# Patient Record
Sex: Female | Born: 1995 | Race: Black or African American | Hispanic: No | Marital: Single | Smoking: Former smoker
Health system: Southern US, Community
[De-identification: ages and names within clinical notes are randomized; demographics above are authoritative.]

---
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
## 2020-11-20 ENCOUNTER — Encounter (HOSPITAL_COMMUNITY): Payer: Self-pay | Admitting: Family Medicine

## 2020-11-20 ENCOUNTER — Other Ambulatory Visit: Payer: Self-pay

## 2020-11-20 ENCOUNTER — Inpatient Hospital Stay (HOSPITAL_COMMUNITY)
Admission: AD | Admit: 2020-11-20 | Discharge: 2020-11-20 | Disposition: A | Discharge status: Home / Self Care | Payer: Medicaid Other | Attending: Family Medicine | Admitting: Family Medicine

## 2020-11-20 DIAGNOSIS — Z711 Person with feared health complaint in whom no diagnosis is made: Secondary | ICD-10-CM

## 2020-11-20 DIAGNOSIS — N926 Irregular menstruation, unspecified: Secondary | ICD-10-CM | POA: Insufficient documentation

## 2020-11-20 NOTE — MAU Provider Note (Signed)
  Chief Complaint: No chief complaint on file.   Event Date/Time   First Provider Initiated Contact with Patient 11/20/20 1333      SUBJECTIVE HPI: Felicia Lucas is a 25 y.o. G1P0 who presents to maternity admissions for a pregnancy verification letter. She has no complaints. She has no pain or bleeding.   No past medical history on file. No past surgical history on file. Social History   Socioeconomic History   Marital status: Single    Spouse name: Not on file   Number of children: Not on file   Years of education: Not on file   Highest education level: Not on file  Occupational History   Not on file  Tobacco Use   Smoking status: Never   Smokeless tobacco: Not on file  Substance and Sexual Activity   Alcohol use: No   Drug use: Not on file   Sexual activity: Not on file  Other Topics Concern   Not on file  Social History Narrative   Not on file   Social Determinants of Health   Financial Resource Strain: Not on file  Food Insecurity: Not on file  Transportation Needs: Not on file  Physical Activity: Not on file  Stress: Not on file  Social Connections: Not on file  Intimate Partner Violence: Not on file   No current facility-administered medications on file prior to encounter.   Current Outpatient Medications on File Prior to Encounter  Medication Sig Dispense Refill   ibuprofen (ADVIL,MOTRIN) 200 MG tablet Take 200 mg by mouth every 8 (eight) hours as needed. For pain.     Allergies  Allergen Reactions   Food Rash    Eggplant    ROS:  Review of Systems  Gastrointestinal:  Negative for abdominal pain.  Genitourinary:  Negative for vaginal bleeding.   I have reviewed patient's Past Medical Hx, Surgical Hx, Family Hx, Social Hx, medications and allergies.   Physical Exam  Patient Vitals for the past 24 hrs:  BP Temp Temp src Pulse Resp SpO2 Height Weight  11/20/20 1302 101/66 98.4 F (36.9 C) Oral 77 19 99 % -- --  11/20/20 1257 -- -- -- -- --  -- 5\' 4"  (1.626 m) 85 kg   Physical Exam Vitals and nursing note reviewed.  Constitutional:      General: She is not in acute distress.    Appearance: She is not ill-appearing, toxic-appearing or diaphoretic.  Neurological:     Mental Status: She is alert and oriented to person, place, and time.  Psychiatric:        Behavior: Behavior normal.    MDM Patient denies any concerning symptoms in need of emergent evaluation.   ASSESSMENT MSE Complete  PLAN  Patient may go to [facility] for a pregnancy test and pregnancy verification letter.   [facility] NP 11/20/2020 1:35 PM

## 2020-11-20 NOTE — MAU Note (Signed)
Presents stating has had +HPT and wants confirmation.  Denies VB or abd pain

## 2020-11-21 ENCOUNTER — Ambulatory Visit (INDEPENDENT_AMBULATORY_CARE_PROVIDER_SITE_OTHER): Payer: Medicaid Other

## 2020-11-21 VITALS — BP 105/81 | HR 80 | Ht 64.0 in | Wt 185.8 lb

## 2020-11-21 DIAGNOSIS — Z3201 Encounter for pregnancy test, result positive: Secondary | ICD-10-CM

## 2020-11-21 LAB — POCT PREGNANCY, URINE: Preg Test, Ur: POSITIVE — AB

## 2020-11-21 NOTE — Progress Notes (Signed)
Pt here today for UPT. UPT in office today is positive. Pt denies any vaginal bleeding, abd pain at this time. Pt is taking PNV. Pt advised precautions for MAU. Pt will make New OB INTAKE appt soon. Pt verbalized understanding and agreeable to plan of care.   Pt interested in Mom/Baby Dyad program.   LMP: 10/11/20 EDD: 07/18/2021 [redacted]w[redacted]d  Labib Cwynar, RN

## 2020-11-22 ENCOUNTER — Inpatient Hospital Stay (HOSPITAL_COMMUNITY): Payer: Medicaid Other

## 2020-11-22 ENCOUNTER — Inpatient Hospital Stay (HOSPITAL_COMMUNITY)
Admission: AD | Admit: 2020-11-22 | Discharge: 2020-11-22 | Disposition: A | Discharge status: Home / Self Care | Payer: Medicaid Other | Attending: Obstetrics & Gynecology | Admitting: Obstetrics & Gynecology

## 2020-11-22 DIAGNOSIS — Z3A01 Less than 8 weeks gestation of pregnancy: Secondary | ICD-10-CM | POA: Diagnosis not present

## 2020-11-22 DIAGNOSIS — O3680X0 Pregnancy with inconclusive fetal viability, not applicable or unspecified: Secondary | ICD-10-CM | POA: Diagnosis not present

## 2020-11-22 DIAGNOSIS — Z79899 Other long term (current) drug therapy: Secondary | ICD-10-CM | POA: Insufficient documentation

## 2020-11-22 DIAGNOSIS — O26851 Spotting complicating pregnancy, first trimester: Secondary | ICD-10-CM | POA: Insufficient documentation

## 2020-11-22 DIAGNOSIS — R109 Unspecified abdominal pain: Secondary | ICD-10-CM | POA: Diagnosis not present

## 2020-11-22 DIAGNOSIS — M549 Dorsalgia, unspecified: Secondary | ICD-10-CM | POA: Insufficient documentation

## 2020-11-22 DIAGNOSIS — O4691 Antepartum hemorrhage, unspecified, first trimester: Secondary | ICD-10-CM

## 2020-11-22 DIAGNOSIS — O4690 Antepartum hemorrhage, unspecified, unspecified trimester: Secondary | ICD-10-CM

## 2020-11-22 DIAGNOSIS — O26891 Other specified pregnancy related conditions, first trimester: Secondary | ICD-10-CM | POA: Diagnosis present

## 2020-11-22 LAB — WET PREP, GENITAL
Clue Cells Wet Prep HPF POC: NONE SEEN
Sperm: NONE SEEN
Trich, Wet Prep: NONE SEEN
Yeast Wet Prep HPF POC: NONE SEEN

## 2020-11-22 LAB — CBC
HCT: 37.8 % (ref 36.0–46.0)
Hemoglobin: 13 g/dL (ref 12.0–15.0)
MCH: 30.2 pg (ref 26.0–34.0)
MCHC: 34.4 g/dL (ref 30.0–36.0)
MCV: 87.9 fL (ref 80.0–100.0)
Platelets: 272 10*3/uL (ref 150–400)
RBC: 4.3 MIL/uL (ref 3.87–5.11)
RDW: 12.6 % (ref 11.5–15.5)
WBC: 4.6 10*3/uL (ref 4.0–10.5)
nRBC: 0 % (ref 0.0–0.2)

## 2020-11-22 LAB — URINALYSIS, ROUTINE W REFLEX MICROSCOPIC
Bacteria, UA: NONE SEEN
Bilirubin Urine: NEGATIVE
Glucose, UA: NEGATIVE mg/dL
Ketones, ur: NEGATIVE mg/dL
Leukocytes,Ua: NEGATIVE
Nitrite: NEGATIVE
Protein, ur: NEGATIVE mg/dL
Specific Gravity, Urine: 1.021 (ref 1.005–1.030)
pH: 7 (ref 5.0–8.0)

## 2020-11-22 LAB — ABO/RH: ABO/RH(D): B POS

## 2020-11-22 LAB — HCG, QUANTITATIVE, PREGNANCY: hCG, Beta Chain, Quant, S: 517 m[IU]/mL — ABNORMAL HIGH (ref <5)

## 2020-11-22 MED ORDER — ACETAMINOPHEN 500 MG PO TABS
1000.0000 mg | ORAL_TABLET | Freq: Once | ORAL | Status: AC
  Administered 2020-11-22: 1000 mg via ORAL
  Filled 2020-11-22: qty 2

## 2020-11-22 NOTE — MAU Provider Note (Signed)
History     CSN: [id number]  Arrival date and time: 11/22/20 1943   Event Date/Time   First Provider Initiated Contact with Patient 11/22/20 2123      Chief Complaint  Patient presents with   Vaginal Bleeding   Abdominal Pain   MARGART ZEMANEK is a 25 y.o. G1P0 at [redacted]w[redacted]d by definite LMP of May 19th.  She presents today for Vaginal Bleeding and Abdominal Pain.  She states she noted some bleeding around 4pm today. She states she then started having some cramping that is located in her back and lower abdominal area.  She reports the cramps are constant and rates them a 8/10.  She states she noted the spotting with wiping and it was initially pink, then dark red, and now pink again. Patient states she has not taken anything for the cramping.  She endorses sexual activity in the past 2 days, but denies problems with urination, constipation, or diarrhea.    OB History     Gravida  1   Para      Term      Preterm      AB      Living         SAB      IAB      Ectopic      Multiple      Live Births              No past medical history on file.  No past surgical history on file.  No family history on file.  Social History   Tobacco Use   Smoking status: Never  Substance Use Topics   Alcohol use: No    Allergies:  Allergies  Allergen Reactions   Food Rash    Eggplant    Medications Prior to Admission  Medication Sig Dispense Refill Last Dose   Prenatal Vit-Fe Fumarate-FA (MULTIVITAMIN-PRENATAL) 27-0.8 MG TABS tablet Take 1 tablet by mouth daily at 12 noon.       Review of Systems  Constitutional:  Negative for chills and fever.  Gastrointestinal:  Positive for abdominal pain. Negative for constipation, diarrhea, nausea and vomiting.  Genitourinary:  Positive for dysuria and vaginal bleeding. Negative for difficulty urinating and dyspareunia.  Musculoskeletal:  Positive for back pain.  Neurological:  Positive for headaches (Earlier, none  currently). Negative for dizziness and light-headedness.  Physical Exam   Blood pressure 106/74, pulse 87, temperature 98.8 F (37.1 C), resp. rate 18, height 5\' 4"  (1.626 m), weight 84.8 kg, last menstrual period 10/12/2020.  Physical Exam Vitals reviewed.  Constitutional:      General: She is not in acute distress.    Appearance: She is well-developed.  HENT:     Head: Normocephalic and atraumatic.  Eyes:     Conjunctiva/sclera: Conjunctivae normal.  Cardiovascular:     Rate and Rhythm: Normal rate and regular rhythm.     Heart sounds: Normal heart sounds.  Pulmonary:     Effort: Pulmonary effort is normal. No respiratory distress.     Breath sounds: Normal breath sounds.  Abdominal:     General: Bowel sounds are normal.     Palpations: Abdomen is soft.     Tenderness: There is no abdominal tenderness (Reported, but not elicited).  Skin:    General: Skin is warm and dry.  Neurological:     Mental Status: She is alert and oriented to person, place, and time.  Psychiatric:  Mood and Affect: Mood normal.        Behavior: Behavior normal.        Thought Content: Thought content normal.    MAU Course  Procedures Results for orders placed or performed during the hospital encounter of 11/22/20 (from the past 24 hour(s))  Wet prep, genital     Status: Abnormal   Collection Time: 11/22/20  8:15 PM   Specimen: Vaginal  Result Value Ref Range   Yeast Wet Prep HPF POC NONE SEEN NONE SEEN   Trich, Wet Prep NONE SEEN NONE SEEN   Clue Cells Wet Prep HPF POC NONE SEEN NONE SEEN   WBC, Wet Prep HPF POC FEW (A) NONE SEEN   Sperm NONE SEEN   CBC     Status: None   Collection Time: 11/22/20  8:33 PM  Result Value Ref Range   WBC 4.6 4.0 - 10.5 K/uL   RBC 4.30 3.87 - 5.11 MIL/uL   Hemoglobin 13.0 12.0 - 15.0 g/dL   HCT 62.7 03.5 - 00.9 %   MCV 87.9 80.0 - 100.0 fL   MCH 30.2 26.0 - 34.0 pg   MCHC 34.4 30.0 - 36.0 g/dL   RDW 38.1 82.9 - 93.7 %   Platelets 272 150 - 400  K/uL   nRBC 0.0 0.0 - 0.2 %  hCG, quantitative, pregnancy     Status: Abnormal   Collection Time: 11/22/20  8:33 PM  Result Value Ref Range   hCG, Beta Chain, Quant, S 517 (H) <5 mIU/mL  ABO/Rh     Status: None   Collection Time: 11/22/20  8:33 PM  Result Value Ref Range   ABO/RH(D) B POS    No rh immune globuloin      NOT A RH IMMUNE GLOBULIN CANDIDATE, PT RH POSITIVE Performed at [facility], [address] [place], [place] [address]   Urinalysis, Routine w reflex microscopic Urine, Clean Catch     Status: Abnormal   Collection Time: 11/22/20  8:39 PM  Result Value Ref Range   Color, Urine YELLOW YELLOW   APPearance CLOUDY (A) CLEAR   Specific Gravity, Urine 1.021 1.005 - 1.030   pH 7.0 5.0 - 8.0   Glucose, UA NEGATIVE NEGATIVE mg/dL   Hgb urine dipstick LARGE (A) NEGATIVE   Bilirubin Urine NEGATIVE NEGATIVE   Ketones, ur NEGATIVE NEGATIVE mg/dL   Protein, ur NEGATIVE NEGATIVE mg/dL   Nitrite NEGATIVE NEGATIVE   Leukocytes,Ua NEGATIVE NEGATIVE   RBC / HPF 0-5 0 - 5 RBC/hpf   Bacteria, UA NONE SEEN NONE SEEN   Squamous Epithelial / LPF 6-10 0 - 5   Mucus PRESENT    US OB LESS THAN 14 WEEKS WITH OB TRANSVAGINAL  Result Date: 11/22/2020 CLINICAL DATA:  Vaginal bleeding EXAM: OBSTETRIC <14 WK [place] AND TRANSVAGINAL OB US TECHNIQUE: Both transabdominal and transvaginal ultrasound examinations were performed for complete evaluation of the gestation as well as the maternal uterus, adnexal regions, and pelvic cul-de-sac. Transvaginal technique was performed to assess early pregnancy. COMPARISON:  None. FINDINGS: Intrauterine gestational sac: None Yolk sac:  Not Visualized. Embryo:  Not Visualized. Maternal uterus/adnexae: Ovaries are within normal limits. Right ovary measures 2.8 x 2.9 by 2.4 cm. The left ovary measures 3.5 x 3 by 2.5 cm. No significant free fluid IMPRESSION: No IUP identified. Findings consistent with pregnancy of unknown location, differential of which includes  IUP too early to visualize, recent failed pregnancy, and occult ectopic pregnancy. Recommend trending of HCG  with repeat ultrasound as indicated Electronically Signed   By: Jasmine Pang M.D.   On: 11/22/2020 21:54    MDM Physical Exam Wet Prep and GC/CT Labs: UA, UPT, CBC, hCG, ABO Ultrasound Analgesic Coordination of Follow Up Assessment and Plan  25 year old G1P0 at 5.6 weeks Vaginal Bleeding Back Pain  -Labs ordered and collected from triage. -Provider to bedside to discuss POC and complete MSE. -Patient offered and accepts pain medication. -Will give tylenol now.  -Will send for [place] and await results.   Cherre Robins 11/22/2020, 9:24 PM    Reassessment (10:30 PM)  -Results return as above. -Provider to discuss with patient.  -Patient reports increased bleeding s/p ultrasound. -Educated on increased cervical sensitivity and friability during pregnancy. -Instructed to monitor. -Discussed need for further evaluation to determine fetal viability. -Advised to return to MAU in 48 hours for repeat hCG.  -Encouraged to call or return to MAU if symptoms worsen or with the onset of new symptoms. -Discharged to home in stable condition.  Cherre Robins MSN, CNM Advanced Practice Provider, [facility] [employer] [facility]

## 2020-11-22 NOTE — Progress Notes (Signed)
Chart reviewed for nurse visit. Agree with plan of care.   Felicia Maples, MD 11/22/20 1:40 PM

## 2020-11-22 NOTE — MAU Note (Signed)
Pt stated she started having cramping earlier today. Pain has increased and now she has some spotting.

## 2020-11-23 LAB — GC/CHLAMYDIA PROBE AMP (~~LOC~~) NOT AT ARMC
Chlamydia: NEGATIVE
Comment: NEGATIVE
Comment: NORMAL
Neisseria Gonorrhea: NEGATIVE

## 2020-11-24 ENCOUNTER — Inpatient Hospital Stay (HOSPITAL_COMMUNITY)
Admission: AD | Admit: 2020-11-24 | Discharge: 2020-11-24 | Disposition: A | Discharge status: Home / Self Care | Payer: Medicaid Other | Attending: Family Medicine | Admitting: Family Medicine

## 2020-11-24 DIAGNOSIS — O3680X0 Pregnancy with inconclusive fetal viability, not applicable or unspecified: Secondary | ICD-10-CM | POA: Diagnosis present

## 2020-11-24 DIAGNOSIS — Z3A01 Less than 8 weeks gestation of pregnancy: Secondary | ICD-10-CM | POA: Diagnosis not present

## 2020-11-24 LAB — HCG, QUANTITATIVE, PREGNANCY: hCG, Beta Chain, Quant, S: 336 m[IU]/mL — ABNORMAL HIGH (ref <5)

## 2020-11-24 NOTE — MAU Provider Note (Signed)
None    S Ms. Felicia Lucas is a 25 y.o. G1P0 patient who presents to MAU today for repeat hcg. Patient denies pain or vaginal bleeding today.    O BP 107/70 (BP Location: Right Arm)   Temp 98.1 F (36.7 C)   Resp 16   Ht 5\' 4"  (1.626 m)   Wt 84.8 kg   LMP 10/12/2020 (Approximate)   BMI 32.10 kg/m  Physical Exam Cardiovascular:     Rate and Rhythm: Normal rate.  Pulmonary:     Effort: Pulmonary effort is normal.  Abdominal:     General: Abdomen is flat.     Palpations: Abdomen is soft.  Musculoskeletal:        General: Normal range of motion.  Neurological:     General: No focal deficit present.     Mental Status: She is alert.  Psychiatric:        Mood and Affect: Mood normal.        Behavior: Behavior normal.        Thought Content: Thought content normal.        Judgment: Judgment normal.     A Medical screening exam complete Hcg dropped from 517 to 336  Dr. 10/14/2020 on unit; likely SAB but given that hcg did not drop by 50%, cannot rule out ectopic pregnancy    P Discharge from MAU in stable condition Patient to return in 48 hours for repeat hcg Strict return precautions reviewed with patient and significant other     Shawnie Pons, MSN, CNM 11/24/2020 9:52 PM

## 2020-11-24 NOTE — MAU Note (Signed)
Here for repeat BHCG. Cramping stopped today and spotting has decreased.

## 2020-11-24 NOTE — MAU Note (Addendum)
Camelia Eng CNM in Family Rm to discuss test results and d/c plan. Pt was then d/c home by [facility]

## 2020-11-28 ENCOUNTER — Inpatient Hospital Stay (HOSPITAL_COMMUNITY): Payer: Medicaid Other

## 2020-11-28 ENCOUNTER — Inpatient Hospital Stay (HOSPITAL_COMMUNITY)
Admission: AD | Admit: 2020-11-28 | Discharge: 2020-11-28 | Disposition: A | Discharge status: Home / Self Care | Payer: Medicaid Other | Attending: Obstetrics and Gynecology | Admitting: Obstetrics and Gynecology

## 2020-11-28 ENCOUNTER — Inpatient Hospital Stay (HOSPITAL_COMMUNITY)
Admission: AD | Admit: 2020-11-28 | Discharge: 2020-11-28 | Disposition: A | Discharge status: Home / Self Care | Payer: Medicaid Other | Source: Home / Self Care | Attending: Obstetrics and Gynecology | Admitting: Obstetrics and Gynecology

## 2020-11-28 ENCOUNTER — Other Ambulatory Visit: Payer: Self-pay

## 2020-11-28 DIAGNOSIS — O3680X0 Pregnancy with inconclusive fetal viability, not applicable or unspecified: Secondary | ICD-10-CM | POA: Diagnosis not present

## 2020-11-28 DIAGNOSIS — O0281 Inappropriate change in quantitative human chorionic gonadotropin (hCG) in early pregnancy: Secondary | ICD-10-CM | POA: Insufficient documentation

## 2020-11-28 DIAGNOSIS — Z3A01 Less than 8 weeks gestation of pregnancy: Secondary | ICD-10-CM | POA: Insufficient documentation

## 2020-11-28 DIAGNOSIS — O26891 Other specified pregnancy related conditions, first trimester: Secondary | ICD-10-CM

## 2020-11-28 LAB — CBC
HCT: 36 % (ref 36.0–46.0)
Hemoglobin: 12 g/dL (ref 12.0–15.0)
MCH: 29.7 pg (ref 26.0–34.0)
MCHC: 33.3 g/dL (ref 30.0–36.0)
MCV: 89.1 fL (ref 80.0–100.0)
Platelets: 251 10*3/uL (ref 150–400)
RBC: 4.04 MIL/uL (ref 3.87–5.11)
RDW: 12.6 % (ref 11.5–15.5)
WBC: 3.9 10*3/uL — ABNORMAL LOW (ref 4.0–10.5)
nRBC: 0 % (ref 0.0–0.2)

## 2020-11-28 LAB — COMPREHENSIVE METABOLIC PANEL
ALT: 12 U/L (ref 0–44)
AST: 16 U/L (ref 15–41)
Albumin: 3.6 g/dL (ref 3.5–5.0)
Alkaline Phosphatase: 39 U/L (ref 38–126)
Anion gap: 7 (ref 5–15)
BUN: 5 mg/dL — ABNORMAL LOW (ref 6–20)
CO2: 23 mmol/L (ref 22–32)
Calcium: 9 mg/dL (ref 8.9–10.3)
Chloride: 106 mmol/L (ref 98–111)
Creatinine, Ser: 0.84 mg/dL (ref 0.44–1.00)
GFR, Estimated: >60 mL/min (ref >60)
Glucose, Bld: 102 mg/dL — ABNORMAL HIGH (ref 70–99)
Potassium: 3.3 mmol/L — ABNORMAL LOW (ref 3.5–5.1)
Sodium: 136 mmol/L (ref 135–145)
Total Bilirubin: 0.5 mg/dL (ref 0.3–1.2)
Total Protein: 6.6 g/dL (ref 6.5–8.1)

## 2020-11-28 LAB — HCG, QUANTITATIVE, PREGNANCY: hCG, Beta Chain, Quant, S: 416 m[IU]/mL — ABNORMAL HIGH (ref <5)

## 2020-11-28 MED ORDER — METHOTREXATE FOR ECTOPIC PREGNANCY
50.0000 mg/m2 | Freq: Once | INTRAMUSCULAR | Status: AC
  Administered 2020-11-28: 98 mg via INTRAMUSCULAR
  Filled 2020-11-28: qty 3.92

## 2020-11-28 MED ORDER — HYDROCODONE-ACETAMINOPHEN 5-325 MG PO TABS
2.0000 | ORAL_TABLET | Freq: Once | ORAL | Status: AC
  Administered 2020-11-28: 2 via ORAL
  Filled 2020-11-28: qty 2

## 2020-11-28 NOTE — Progress Notes (Signed)
Pt read MTX information and did not have any further questions at d/c. Written and verbal d/c instructions given and understanding voiced

## 2020-11-28 NOTE — MAU Provider Note (Addendum)
History     CSN: [id number]  Arrival date and time: 11/28/20 1630   None  Chief Complaint  Patient presents with   Injections   HPI  Felicia Lucas is a 25 y.o. G1P0 at [redacted]w[redacted]d with pregnancy of unknown location who returns to MAU for repeat ultrasound following inappropriate rise in quant hCG. She is s/p evaluation earlier today and for probably Methotrexate administration. Patient continues to experience abdominal pain and vaginal bleeding, initial onset 11/22/2020.  OB History     Gravida  1   Para      Term      Preterm      AB      Living         SAB      IAB      Ectopic      Multiple      Live Births             Social History   Tobacco Use   Smoking status: Never  Substance Use Topics   Alcohol use: No    Allergies:  Allergies  Allergen Reactions   Food Rash    Eggplant    Medications Prior to Admission  Medication Sig Dispense Refill Last Dose   Prenatal Vit-Fe Fumarate-FA (MULTIVITAMIN-PRENATAL) 27-0.8 MG TABS tablet Take 1 tablet by mouth daily at 12 noon.       Review of Systems  Gastrointestinal:  Positive for abdominal pain.  All other systems reviewed and are negative. Physical Exam   Last menstrual period 10/12/2020.  Physical Exam Vitals and nursing note reviewed. Exam conducted with a chaperone present.  Constitutional:      Appearance: Normal appearance.  Cardiovascular:     Rate and Rhythm: Normal rate.     Pulses: Normal pulses.  Pulmonary:     Effort: Pulmonary effort is normal.  Abdominal:     Tenderness: There is no abdominal tenderness.  Skin:    Capillary Refill: Capillary refill takes less than 2 seconds.  Neurological:     Mental Status: She is alert and oriented to person, place, and time.  Psychiatric:        Mood and Affect: Mood normal.        Behavior: Behavior normal.        Thought Content: Thought content normal.        Judgment: Judgment normal.    MAU Course  Procedures  --Plan for  probable Methotrexate administration made during previous MAU encounter s/p consult with Dr. Vergie Living. Once current encounter's results were obtained, update provided to Dr. Charlotta Newton, who agrees patient is appropriate for MTX.  Component     Latest Ref Rng & Units 11/22/2020 11/24/2020 11/28/2020  HCG, Beta Chain, Quant, S     <5 mIU/mL 517 (H) 336 (H) 416 (H)   Patient Vitals for the past 24 hrs:  BP Temp Temp src Pulse Resp SpO2  11/28/20 1647 118/80 98.4 F (36.9 C) Oral 81 20 100 %   Results for orders placed or performed during the hospital encounter of 11/28/20 (from the past 24 hour(s))  CBC     Status: Abnormal   Collection Time: 11/28/20  2:06 PM  Result Value Ref Range   WBC 3.9 (L) 4.0 - 10.5 K/uL   RBC 4.04 3.87 - 5.11 MIL/uL   Hemoglobin 12.0 12.0 - 15.0 g/dL   HCT 61.9 50.9 - 32.6 %   MCV 89.1 80.0 - 100.0 fL   MCH 29.7 26.0 -  34.0 pg   MCHC 33.3 30.0 - 36.0 g/dL   RDW 78.5 88.5 - 02.7 %   Platelets 251 150 - 400 K/uL   nRBC 0.0 0.0 - 0.2 %  Comprehensive metabolic panel     Status: Abnormal   Collection Time: 11/28/20  2:06 PM  Result Value Ref Range   Sodium 136 135 - 145 mmol/L   Potassium 3.3 (L) 3.5 - 5.1 mmol/L   Chloride 106 98 - 111 mmol/L   CO2 23 22 - 32 mmol/L   Glucose, Bld 102 (H) 70 - 99 mg/dL   BUN 5 (L) 6 - 20 mg/dL   Creatinine, Ser 7.41 0.44 - 1.00 mg/dL   Calcium 9.0 8.9 - 28.7 mg/dL   Total Protein 6.6 6.5 - 8.1 g/dL   Albumin 3.6 3.5 - 5.0 g/dL   AST 16 15 - 41 U/L   ALT 12 0 - 44 U/L   Alkaline Phosphatase 39 38 - 126 U/L   Total Bilirubin 0.5 0.3 - 1.2 mg/dL   GFR, Estimated >86 >76 mL/min   Anion gap 7 5 - 15   US OB LESS THAN 14 WEEKS WITH OB TRANSVAGINAL  Result Date: 11/28/2020 CLINICAL DATA:  Abdominal pain. Estimated gestational age of [redacted] weeks, 5 days by LMP. EXAM: OBSTETRIC <14 WK [place] AND TRANSVAGINAL OB US TECHNIQUE: Both transabdominal and transvaginal ultrasound examinations were performed for complete evaluation of the gestation as  well as the maternal uterus, adnexal regions, and pelvic cul-de-sac. Transvaginal technique was performed to assess early pregnancy. COMPARISON:  OB ultrasound dated November 22, 2020. FINDINGS: Intrauterine gestational sac: None. Maternal uterus/adnexae: Unremarkable.  Right ovarian corpus luteum. IMPRESSION: No IUP is visualized. By definition, in the setting of a positive pregnancy test, this reflects a pregnancy of unknown location. Differential considerations include early normal IUP, abnormal IUP/missed abortion, or nonvisualized ectopic pregnancy. Serial beta HCG is suggested. Consider repeat pelvic ultrasound in 14 days. Electronically Signed   By: Obie Dredge M.D.   On: 11/28/2020 18:30    Methotrexate Treatment Protocol for Ectopic Pregnancy  Pretreatment testing and instructions  hCG concentration  Transvaginal ultrasound  Blood group and Rh(D) typing; give Rhogam 300 mcg IM, if indicated  Complete blood count  Liver and renal function tests  Discontinue folic acid supplements  Counsel patient to avoid NSAIDs, recommend acetaminophen if an analgesic is needed  Advise patient to refrain from sexual intercourse and strenuous exercise   The risks of methotrexate were reviewed including failure requiring repeat dosing or eventual surgery. She understands that methotrexate involves frequent return visits to monitor lab values and that she remains at risk of ectopic rupture until her beta is less than assay. ?The patient opts to proceed with methotrexate.  She has no history of hepatic or renal dysfunction, has normal BUN/Cr/LFT's/platelets.  She is felt to be reliable for follow-up. Side effects of photosensitivity & GI upset were discussed.  She knows to avoid direct sunlight and abstain from alcohol, NSAIDs and sexual intercourse for two weeks. She was counseled to discontinue any MVI with folic acid.  ?Strict ectopic precautions were reviewed, the patient knows to call with any abdominal  pain, vomiting, fainting, or any concerns with her health.   Treatment day  Single dose protocol   1 Tues 07/05 hCG.  Administer Methotrexate 50 mg/m2 body surface area IM  4 Fri 07/08 hCG  7 Mon 07/11 hCG  If <15 percent hCG decline from day 4 to 7, give additional  dose of methotrexate 50 mg/m2 IM  If ?15 percent hCG decline from day 4 to 7, draw hCG weekly until undetectable   Prepared with data from:   [facility] KT. Clinical practice. Ectopic pregnancy. Malva Limes Med 2009; 361:379  American College of Obstetricians and Gynecologists. ACOG Practice Bulletin No. 94: Medical management of ectopic pregnancy. Obstet Gynecol 2008; 782:4235.  Assessment and Plan  --25 y.o. G1P0 at [redacted]w[redacted]d by LMP --Inappropriate change in quant hCG --Methotrexate given in MAU s/p discussion with Dr Vergie Living and Dr. Charlotta Newton --Blood type  B POS --Reviewed expectations for change in pain score, bleeding, office followup --Discharge home in stable condition  F/U:Message sent to [facility] to coordinate follow-up non-stat Quant hCGs.  Clayton Bibles, MSN, CNM Certified Nurse Midwife, Biochemist, clinical for [employer], [facility]

## 2020-11-28 NOTE — MAU Provider Note (Signed)
Event Date/Time  First Provider Initiated Contact with Patient 11/28/20 1452     S Ms. Felicia Lucas is a 25 y.o. G1P0 patient who presents to MAU today for repeat stat Quant hCG. She endorses ongoing vaginal bleeding and abdominal cramping. Onset 11/22/2020. Pain score 7/10. No relief with Tylenol.  O BP 107/70 (BP Location: Right Arm)   Pulse 88   Temp (!) 97.4 F (36.3 C) (Oral)   Resp 16   LMP 10/12/2020 (Approximate)   SpO2 100% Comment: room air   Physical Exam Vitals and nursing note reviewed. Exam conducted with a chaperone present.  Constitutional:      General: She is not in acute distress.    Appearance: Normal appearance. She is not toxic-appearing.  Cardiovascular:     Rate and Rhythm: Normal rate.     Pulses: Normal pulses.  Pulmonary:     Effort: Pulmonary effort is normal.  Neurological:     Mental Status: She is alert and oriented to person, place, and time.  Psychiatric:        Mood and Affect: Mood normal.        Behavior: Behavior normal.        Thought Content: Thought content normal.        Judgment: Judgment normal.   A Medical screening exam complete Inappropriate change in quant hCG  P Discussed with Dr. Vergie Living, concern for ectopic pregnancy Repeat ultrasound and Methotrexate indicated  F/U: Patient called by CNM at 1540, results and concern for ectopic discussed Pt confirms she can return to MAU for labs, repeat ultrasound and likely Methotrexate Patient states she is inbound to hospital now  [facility] 11/28/2020 3:45 PM

## 2020-11-28 NOTE — MAU Note (Signed)
Felicia Lucas is a 25 y.o. at [redacted]w[redacted]d here in MAU reporting: here for follow up hcg. Having pain and bleeding. States pain was worse yesterday but is still having pain today. States bleeding is a little bit lighter than a normal period for her- is changing a pad every couple of hours.  Onset of complaint: ongoing  Pain score: 7/10  Vitals:   11/28/20 1429  BP: 107/70  Pulse: 88  Resp: 16  Temp: (!) 97.4 F (36.3 C)  SpO2: 100%     Lab orders placed from triage: hcg

## 2020-11-28 NOTE — MAU Note (Signed)
Felicia Lucas is a 25 y.o. at [redacted]w[redacted]d here in MAU reporting: here for u/s and mtx. Ongoing pain and bleeding.  Onset of complaint: ongoing  Pain score: 8/10  Vitals:   11/28/20 1647  BP: 118/80  Pulse: 81  Resp: 20  Temp: 98.4 F (36.9 C)  SpO2: 100%     Lab orders placed from triage: cbc, cmp

## 2020-12-01 ENCOUNTER — Telehealth: Payer: Self-pay | Admitting: Family Medicine

## 2020-12-01 ENCOUNTER — Other Ambulatory Visit: Payer: Self-pay

## 2020-12-01 ENCOUNTER — Inpatient Hospital Stay (HOSPITAL_COMMUNITY): Payer: Medicaid Other

## 2020-12-01 ENCOUNTER — Inpatient Hospital Stay (HOSPITAL_COMMUNITY)
Admission: AD | Admit: 2020-12-01 | Discharge: 2020-12-01 | Disposition: A | Discharge status: Home / Self Care | Payer: Medicaid Other | Attending: Obstetrics & Gynecology | Admitting: Obstetrics & Gynecology

## 2020-12-01 DIAGNOSIS — O009 Unspecified ectopic pregnancy without intrauterine pregnancy: Secondary | ICD-10-CM

## 2020-12-01 DIAGNOSIS — O0281 Inappropriate change in quantitative human chorionic gonadotropin (hCG) in early pregnancy: Secondary | ICD-10-CM

## 2020-12-01 DIAGNOSIS — Z3A01 Less than 8 weeks gestation of pregnancy: Secondary | ICD-10-CM | POA: Diagnosis not present

## 2020-12-01 DIAGNOSIS — O00102 Left tubal pregnancy without intrauterine pregnancy: Secondary | ICD-10-CM | POA: Diagnosis not present

## 2020-12-01 DIAGNOSIS — O26891 Other specified pregnancy related conditions, first trimester: Secondary | ICD-10-CM | POA: Insufficient documentation

## 2020-12-01 DIAGNOSIS — R109 Unspecified abdominal pain: Secondary | ICD-10-CM | POA: Diagnosis not present

## 2020-12-01 LAB — HCG, QUANTITATIVE, PREGNANCY: hCG, Beta Chain, Quant, S: 694 m[IU]/mL — ABNORMAL HIGH (ref <5)

## 2020-12-01 NOTE — MAU Note (Signed)
Felicia Lucas is a 25 y.o. at [redacted]w[redacted]d here in MAU reporting: here for day 4 labs post MTX. Having bleeding and pain still. States bleeding is about the same as previous visit and pain is improved slightly.  Onset of complaint: ongoing  Pain score: 4/10  Vitals:   12/01/20 1649  BP: 108/71  Pulse: 89  Resp: 16  Temp: 98 F (36.7 C)  SpO2: 100%     Lab orders placed from triage: hcg

## 2020-12-01 NOTE — MAU Provider Note (Signed)
History     CSN: [id number]  Arrival date and time: 12/01/20 1555   Event Date/Time   First Provider Initiated Contact with Patient 12/01/20 1646      Chief Complaint  Patient presents with   Follow-up   HPI Felicia Lucas is a 25 y.o. G1P0 at [redacted]w[redacted]d who presents to MAU from [facility] for evaluation of ongoing abdominal pain. She is s/p Methotrexate on 11/28/2020. She presented to [facility] for scheduled Day 4 Quant hCG and staff there became concerned by her report of recurrent abdominal pain and advised her to present to MAU for further evaluation  On arrival to MAU patient states her pain is "much better" than 11/28/2020. She states it waxes and wanes throughout the day. She continues to experience vaginal bleeding.  OB History     Gravida  1   Para      Term      Preterm      AB      Living         SAB      IAB      Ectopic      Multiple      Live Births              No past medical history on file.  No past surgical history on file.  No family history on file.  Social History   Tobacco Use   Smoking status: Never  Substance Use Topics   Alcohol use: No    Allergies:  Allergies  Allergen Reactions   Food Rash    Eggplant    No medications prior to admission.    Review of Systems  Gastrointestinal:  Positive for abdominal pain.  Genitourinary:  Positive for vaginal bleeding.  All other systems reviewed and are negative. Physical Exam   Blood pressure 108/71, pulse 89, temperature 98 F (36.7 C), temperature source Oral, resp. rate 16, last menstrual period 10/12/2020, SpO2 100 %.  Physical Exam Vitals and nursing note reviewed. Exam conducted with a chaperone present.  Constitutional:      Appearance: Normal appearance.  Cardiovascular:     Rate and Rhythm: Normal rate.     Pulses: Normal pulses.  Pulmonary:     Effort: Pulmonary effort is normal.  Skin:    Capillary Refill: Capillary refill takes less than 2 seconds.   Neurological:     Mental Status: She is alert and oriented to person, place, and time.    MAU Course  Procedures  --Methotrexate administered 11/28/2020 following inappropriate change in Quant hCG over time in setting of abdominal pain  --Today's ultrasound is first visualization of left adnexal ectopic.  --Dr. Charlotta Newton in unit to discuss today's assessment and present treatment options.  Component     Latest Ref Rng & Units 11/22/2020 11/24/2020 11/28/2020 12/01/2020  HCG, Beta Chain, Quant, S     <5 mIU/mL 517 (H) 336 (H) 416 (H) 694 (H)    Orders Placed This Encounter  Procedures   US OB LESS THAN 14 WEEKS WITH OB TRANSVAGINAL   hCG, quantitative, pregnancy   Discharge patient    Patient Vitals for the past 24 hrs:  BP Temp Temp src Pulse Resp SpO2  12/01/20 2008 96/79 -- -- 78 -- --  12/01/20 1649 108/71 98 F (36.7 C) Oral 89 16 100 %   Results for orders placed or performed during the hospital encounter of 12/01/20 (from the past 24 hour(s))  hCG, quantitative, pregnancy  Status: Abnormal   Collection Time: 12/01/20  4:38 PM  Result Value Ref Range   hCG, Beta Chain, Quant, S 694 (H) <5 mIU/mL   US OB LESS THAN 14 WEEKS WITH OB TRANSVAGINAL  Result Date: 12/01/2020 CLINICAL DATA:  25 year old pregnant female status post methotrexate treatment on 11/28/2020 presenting with pelvic pain. LMP: 10/12/2020 corresponding to an estimated gestational age of [redacted] weeks, 1 day. EXAM: OBSTETRIC <14 WK [place] AND TRANSVAGINAL OB US TECHNIQUE: Transvaginal ultrasound was performed for complete evaluation of the gestation as well as the maternal uterus, adnexal regions, and pelvic cul-de-sac. COMPARISON:  Pelvic ultrasound dated 11/28/2020. FINDINGS: The uterus is retroverted and appears unremarkable. The endometrium measures 13 mm in thickness. No intrauterine pregnancy identified. The right ovary measures 3.9 x 2.7 x 1.9 cm and appears unremarkable. The left ovary measures 3.1 x 2.8 x 2.3 cm and  appears unremarkable. There is a 2.8 x 1.4 x 1.6 cm solid echogenic mass adjacent and lateral to the left ovary which appears separate from the ovary and most concerning for ectopic pregnancy. Some vascularity noted along the periphery of this mass. No significant free fluid within the pelvis. IMPRESSION: 1. No intrauterine pregnancy identified. 2. Solid mass lateral to the left ovary most concerning for an ectopic pregnancy. Clinical correlation is recommended. 3. No significant free fluid within the pelvis. These results were called by telephone at the time of interpretation on 12/01/2020 at 7:12 pm to provider [facility] , who verbally acknowledged these results. Electronically Signed   By: Elgie Collard M.D.   On: 12/01/2020 19:15    Assessment and Plan  --25 y.o. G1P0  --Left ectopic pregnancy --S/p Methotrexate 11/28/2020 --Per patient preference, continue to monitor symptoms until Day 7 labs --VSS, pain score less than baseline --Discharge home in stable condition with strict return precautions  F/U: --Patient has appointment on 12/04/2020 for Day 7 labs  Felicia Lucas, [place] 12/01/2020, 8:52 PM

## 2020-12-04 ENCOUNTER — Other Ambulatory Visit: Payer: Self-pay

## 2020-12-04 ENCOUNTER — Other Ambulatory Visit (INDEPENDENT_AMBULATORY_CARE_PROVIDER_SITE_OTHER): Payer: Medicaid Other

## 2020-12-04 DIAGNOSIS — O009 Unspecified ectopic pregnancy without intrauterine pregnancy: Secondary | ICD-10-CM

## 2020-12-04 LAB — BETA HCG QUANT (REF LAB): hCG Quant: 453 m[IU]/mL

## 2020-12-04 NOTE — Progress Notes (Signed)
Opened in error. Encounter closed.

## 2020-12-04 NOTE — Progress Notes (Signed)
Pt here today for Stat Beta as follow up from MAU on 7/5 after methotrexate. Pt is here for day 7 draw. Pt states vaginal bleeding and cramps stopped on 12/02/20. Denies any vaginal bleeding or cramps today.  Pt advised will have lab drawn and after review with provider, then will be called with results and recommendations. Pt agreeable to plan of care.  Pt states would like to plan for future pregnancy.   Judeth Cornfield, RN

## 2020-12-04 NOTE — Telephone Encounter (Signed)
Call placed back to pt. Left detailed message for pt. Pt has nurse visit appt today at 11am. Pt advised she can return call to office or send mychart message with questions. Pt also advised to still keep appt today for labs.  Judeth Cornfield, RN

## 2020-12-04 NOTE — Progress Notes (Signed)
Reviewed results with Dr Donavan Foil. Pt advised to have repeat non-stat beta in 1 week to follow until negative. Pt had appropriate drop after Methotrexate given on 11/28/20. Call placed to pt. Spoke with pt. Pt given results and recommendations per Dr Donavan Foil. Pt verbalized understanding and agreeable to plan of care.  Pt has Lab appt on 7/18 at 11am. Pt agreeable to date and time of appt.   Judeth Cornfield, RN

## 2020-12-04 NOTE — Progress Notes (Signed)
Patient was assessed and managed by nursing staff during this encounter. I have reviewed the chart and agree with the documentation and plan. I have also made any necessary editorial changes.  Warden Fillers, MD 12/04/2020 7:51 PM

## 2020-12-11 ENCOUNTER — Other Ambulatory Visit: Payer: Medicaid Other

## 2020-12-11 ENCOUNTER — Other Ambulatory Visit: Payer: Self-pay

## 2020-12-11 DIAGNOSIS — O009 Unspecified ectopic pregnancy without intrauterine pregnancy: Secondary | ICD-10-CM

## 2020-12-12 LAB — BETA HCG QUANT (REF LAB): hCG Quant: 210 m[IU]/mL

## 2020-12-13 ENCOUNTER — Telehealth: Payer: Medicaid Other

## 2020-12-13 ENCOUNTER — Telehealth: Payer: Self-pay | Admitting: Lactation Services

## 2020-12-13 NOTE — Telephone Encounter (Signed)
-----   Message from Warden Fillers, MD sent at 12/12/2020  9:11 AM EDT ----- Bhcg quant continues to decrease from methotrexate therapy.  Advise recheck weekly until normal.

## 2020-12-13 NOTE — Telephone Encounter (Signed)
Called patient and gave her results of HCG. Reviewed levels are decreasing as expected and weekly Hcg are recommended until levels decrease to 0. Reviewed she will come in for Non Stat labs and then will be called with results.   Patient with appointments for new OB intake today and new OB next week. Patient informed appointments will be cancelled as she is no longer pregnant. Patient voiced understanding.   Patient reports she has no questions or concerns today.

## 2020-12-21 ENCOUNTER — Encounter: Payer: Medicaid Other | Admitting: Obstetrics and Gynecology

## 2020-12-27 ENCOUNTER — Other Ambulatory Visit: Payer: Medicaid Other

## 2020-12-27 ENCOUNTER — Other Ambulatory Visit: Payer: Self-pay

## 2020-12-27 ENCOUNTER — Other Ambulatory Visit: Payer: Self-pay | Admitting: General Practice

## 2020-12-27 DIAGNOSIS — O009 Unspecified ectopic pregnancy without intrauterine pregnancy: Secondary | ICD-10-CM

## 2020-12-28 LAB — BETA HCG QUANT (REF LAB): hCG Quant: 30 m[IU]/mL

## 2021-01-03 ENCOUNTER — Other Ambulatory Visit: Payer: Self-pay | Admitting: General Practice

## 2021-01-03 ENCOUNTER — Other Ambulatory Visit: Payer: Medicaid Other

## 2021-01-03 DIAGNOSIS — O009 Unspecified ectopic pregnancy without intrauterine pregnancy: Secondary | ICD-10-CM

## 2021-01-04 LAB — BETA HCG QUANT (REF LAB): hCG Quant: 11 m[IU]/mL

## 2021-01-10 ENCOUNTER — Other Ambulatory Visit: Payer: Self-pay | Admitting: General Practice

## 2021-01-10 ENCOUNTER — Other Ambulatory Visit: Payer: Self-pay

## 2021-01-10 ENCOUNTER — Other Ambulatory Visit: Payer: Medicaid Other

## 2021-01-10 DIAGNOSIS — O009 Unspecified ectopic pregnancy without intrauterine pregnancy: Secondary | ICD-10-CM

## 2021-01-11 LAB — BETA HCG QUANT (REF LAB): hCG Quant: 3 m[IU]/mL

## 2021-01-15 ENCOUNTER — Telehealth: Payer: Self-pay

## 2021-01-15 NOTE — Telephone Encounter (Addendum)
-----   Message from Warden Fillers, MD sent at 01/15/2021  8:23 AM EDT ----- Bhcg almost down to normal, recommend recheck in 2-3 weeks   Called pt; VM left stating I am calling with results and requesting a call back. MyChart message sent.

## 2021-01-24 NOTE — Telephone Encounter (Signed)
Call placed to pt again. No answer. Left VM with results and advised to call office or send mychart message to set up beta in 2-3 weeks.  Judeth Cornfield, RN

## 2021-05-27 NOTE — L&D Delivery Note (Addendum)
Delivery Note At 00:33 AM, patient progressed to complete. After period of laboring down and pushing, at 2:59 AM a viable and stunned  female was delivered via Vaginal, Spontaneous (Presentation:  LOA ). Tight Nuchal cord was noted during the delivery. Anterior shoulder delivered without complication. However, tight nuchal was restricting full delivery of the baby. Decision was made to clamp and cut the cord to facilitate delivery of the remainder of the infant. APGAR: 2, 5, 8; weight 3720g.   Placenta status: Spontaneous, Intact.   Cord: 3 vessels with the following complications: None  Anesthesia: Epidural Episiotomy: None Lacerations: Left periurethral Suture Repair:  hemostatic without repair Est. Blood Loss (mL): 193  Mom to postpartum.  Baby to NICU. Venous blood gas was obtained.  Ilda Basset [facility] FM PGY3 Visiting Resident 01/17/2022, 3:32 AM  ___________ GME ATTESTATION:  Evaluation and management procedures were performed by the [facility] Resident under my supervision. I was immediately available for direct supervision, assistance and direction throughout this encounter.  I also confirm that I have verified the information documented in the resident's note, and that I have also personally reperformed the pertinent components of the physical exam and all of the medical decision making activities.  I have also made any necessary editorial changes.  Myrtie Hawk, DO OB Fellow, Faculty [facility], Center for [facility] 01/17/2022 5:54 AM

## 2021-05-31 ENCOUNTER — Other Ambulatory Visit: Payer: Self-pay

## 2021-05-31 ENCOUNTER — Ambulatory Visit (INDEPENDENT_AMBULATORY_CARE_PROVIDER_SITE_OTHER): Payer: Medicaid Other

## 2021-05-31 DIAGNOSIS — Z3200 Encounter for pregnancy test, result unknown: Secondary | ICD-10-CM

## 2021-05-31 DIAGNOSIS — Z3201 Encounter for pregnancy test, result positive: Secondary | ICD-10-CM | POA: Diagnosis not present

## 2021-05-31 LAB — POCT PREGNANCY, URINE: Preg Test, Ur: POSITIVE — AB

## 2021-05-31 NOTE — Progress Notes (Signed)
Possible Pregnancy  Here today for pregnancy confirmation. UPT in office today is positive. Pt reports first positive home UPT on beginning of December. Reviewed dating with patient:   LMP: 04/11/21 EDD: 01/16/22 7w 1d today  OB history reviewed; one prior ectopic pregnancy. Reviewed medications and allergies with patient. Recommended pt begin prenatal vitamin and schedule prenatal care. Pt would like to schedule prenatal care with our office. Front office notified to schedule appts.  Marjo Bicker, RN 05/31/2021  1:39 PM

## 2021-06-05 ENCOUNTER — Telehealth: Payer: Self-pay | Admitting: Family Medicine

## 2021-06-05 NOTE — Telephone Encounter (Signed)
Called patient to schedule new ob appointments, there was no answer to the phone call so a voicemail was left with the call back number for the office.  

## 2021-06-20 ENCOUNTER — Telehealth (INDEPENDENT_AMBULATORY_CARE_PROVIDER_SITE_OTHER): Payer: Medicaid Other

## 2021-06-20 DIAGNOSIS — Z3480 Encounter for supervision of other normal pregnancy, unspecified trimester: Secondary | ICD-10-CM | POA: Insufficient documentation

## 2021-06-20 DIAGNOSIS — Z3A00 Weeks of gestation of pregnancy not specified: Secondary | ICD-10-CM

## 2021-06-20 MED ORDER — PRENATAL PLUS 27-1 MG PO TABS: 1.0000 | ORAL_TABLET | Freq: Every day | ORAL | 11 refills | Status: AC

## 2021-06-20 NOTE — Progress Notes (Signed)
New OB Intake  I connected with  Felicia Lucas on 06/20/21 at  1:15 PM EST by MyChart Video Visit and verified that I am speaking with the correct person using two identifiers. Nurse is located at [facility] and pt is located at lunch.  I discussed the limitations, risks, security and privacy concerns of performing an evaluation and management service by telephone and the availability of in person appointments. I also discussed with the patient that there may be a patient responsible charge related to this service. The patient expressed understanding and agreed to proceed.  I explained I am completing New OB Intake today. We discussed her EDD of 01/16/22 that is based on LMP of 04/11/21. Pt is G2/P1. I reviewed her allergies, medications, Medical/Surgical/OB history, and appropriate screenings. I informed her of [facility] services. Based on history, this is a/an  pregnancy uncomplicated .   There are no problems to display for this patient.   Concerns addressed today  Delivery Plans:  Plans to deliver at [facility].   MyChart/Babyscripts MyChart access verified. I explained pt will have some visits in office and some virtually. Babyscripts instructions given and order placed. Patient verifies receipt of registration text/e-mail. Account successfully created and app downloaded.  Blood Pressure Cuff  Blood pressure cuff ordered for patient to pick-up from [employer]. Explained after first prenatal appt pt will check weekly and document in Babyscripts.  Weight scale: Patient does / does not  have weight scale. Weight scale ordered for patient to pick up from [employer].   Anatomy US Explained first scheduled [place] will be around 19 weeks. Anatomy US scheduled for 08/24/21 at 9:45a. Pt notified to arrive at 930a.  Labs Discussed Avelina Laine genetic screening with patient. Would like both Panorama and Horizon drawn at new OB visit. Routine prenatal labs needed.  Covid Vaccine Patient has not  covid vaccine.   CenteringPregnancy Candidate? Declined If yes, offer as possibility  Mother/ Baby Dyad Candidate?   Accepted If yes, offer as possibility  Informed patient of [facility] website  and placed link in her AVS.   Social Determinants of Health Food Insecurity: Patient denies food insecurity. WIC Referral: Patient is interested in referral to [facility].  Transportation: Patient denies transportation needs. Childcare: Discussed no children allowed at ultrasound appointments. Offered childcare services; patient declines childcare services at this time.  Send link to Pregnancy Navigators   Placed OB Box on problem list and updated  First visit review I reviewed new OB appt with pt. I explained she will have a pelvic exam, ob bloodwork with genetic screening, and PAP smear. Explained pt will be seen by Dr. Crissie Reese at first visit; encounter routed to appropriate provider. Explained that patient will be seen by pregnancy navigator following visit with provider. [facility] information placed in AVS.   Henrietta Dine, CMA 06/20/2021  1:21 PM

## 2021-06-20 NOTE — Patient Instructions (Signed)
AREA PEDIATRIC/FAMILY PRACTICE PHYSICIANS  Central/[place]outheast [place] ([address]) [facility] Chambliss, MD; Eniola, MD; Hale, MD; Hensel, MD; McDiarmid, MD; McIntyer, MD; Tiasha Helvie, MD; Walden, MD [address] [place], [place] [address] [phone] Mon-Fri 8:30-12:30, 1:30-5:00 Providers come to see babies at [facility] Accepting Medicaid [facility] providers who accept newborns: Koirala, MD; Morrow, MD; Wolters, MD [address], [place], [place] [address] [phone] Mon-Fri 8:00-5:30 Babies seen by providers at [facility] Does NOT accept Medicaid Please call early in hospitalization for appointment (limited availability)  [facility] Mulberry, MD [address] [place], [place] [address] [phone] Mon, Tue, Thur, Fri 8:30-5:00, Wed 10:00-7:00 (closed 1-2pm) Babies seen by [facility] providers Accepting Medicaid Rubin - Pediatrician Rubin, MD [address], [place], [place] [address] [phone] Mon-Fri 8:30-5:00, Sat 8:30-12:00 Provider comes to see babies at [facility] Accepting Medicaid Must have been referred from current patients or contacted office prior to delivery [facility] ([facility]) Brown, MD; Chandler, MD; Ettefagh, MD; Grant, MD; Lester, MD; McCormick, MD; McQueen, MD; Prose, MD; Simha, MD; Stanley, MD; Stryffeler, NP; Tebben, NP [address], [place], [place] [address] [phone] Mon, Tue, Thur, Fri 8:30-5:30, Wed 9:30-5:30, Sat 8:30-12:30 Babies seen by [facility] providers Accepting Medicaid Only accepting infants of first-time parents or siblings of current patients Hospital discharge coordinator will make follow-up appointment Jack Amos [address], [place], [place]  [address] [phone]   Fax - [phone] [facility] [address], [place], [place]  [address] Phone - [phone]   Fax - [phone] Shilpa Gosrani [address], [place], [place]  [address] [phone]  East/[place]ortheast [place] ([address]) [facility] Bates, MD; Brassfield, MD; Cooper, Cox, MD; MD; Davis, MD; Dovico, MD; Ettefaugh, MD; Little, MD; Lowe, MD; Keiffer, MD; Melvin, MD; Sumner, MD; Williams, MD [address], [place], [place] [address] [phone] Mon-Fri 8:30-5:00 (extended evenings Mon-Thur as needed), Sat-Sun 10:00-1:00 Providers come to see babies at [facility] Accepting Medicaid for families of first-time babies and families with all children in the household age 3 and under. Must register with office prior to making appointment (M-F only). [facility] Henson, NP; Knapp, MD; Lalonde, MD; Tysinger, PA [address] [place], [place] [address] [phone] Mon-Fri 8:00-5:00 Babies seen by providers at [facility] Does NOT accept Medicaid/Commercial Insurance Only [facility] ([facility])  Artis, MD; Barnes, MD; Bratton, MD; Coccaro, MD; Lockett Gardner, MD; Kramer, MD; Marshall, MD; Netherton, MD; Poleto, MD; Skinner, MD [address] [place], [place] [address] [phone] Mon-Fri 8:30-5:30, Sat (Oct.-Mar.) 9:00-1:00 Babies seen by providers at [facility] Accepting Medicaid  [place] ([address]) [facility] [place] [facility] Reid, MD; Warner, MD [address], [place], [place] [address] [phone] Mon-Fri 8:30-5:00, Sat 8:30-12:00 Providers come to see babies at [facility] Does NOT accept Medicaid [facility] Becker, PA; Hagler, MD; Scifres, PA; Sun, MD; Swayne, MD [address], [place], [place] [address] [phone] Mon-Fri 8:00-5:00 Babies seen by providers at [facility] Does NOT accept Medicaid Only accepting babies of parents who  are patients Please call early in hospitalization for appointment (limited availability) [facility] Clark, MD; Frye, MD; Kelleher, MD; Mack, NP; Miller, MD; O'Keller, MD; Patterson, NP; Pudlo, MD; Puzio, MD; Thomas, MD; Tucker, MD; Twiselton, MD [address], [place], [place] [address] [phone] Mon-Fri 8:00-5:00, Sat 9:00-12:00 Providers come to see babies at [facility] Does NOT accept Medicaid  [place] ([address]) [facility] providers accepting new patients: Brake, NP; Wharton, PA [address], [place], [place] [address] [phone] Mon-Fri 8:00-5:00 Babies seen by providers at [facility] Does NOT accept Medicaid Only accepting babies of parents who are patients Please call early in hospitalization for appointment (limited availability) [facility] Gay, MD; Quinlan, MD [address] [place], [place] [address] [phone] (press [phone] to schedule appointment) Mon-Fri 8:00-5:00 Providers come to see babies at [facility] Does NOT accept Medicaid [facility] Mazer, MD [address] [place], [place] [address] [phone] Mon-Fri 8:30-5:00 (lunch 12:30-1:00), extended hours by appointment only Wed 5:00-6:30 Babies seen by [facility] providers Accepting Medicaid [facility] Banks, MD; Jordan, MD; Koberlein, MD [address], [place], [place] [address] [phone] Mon-Fri 8:00-5:00 Babies seen by [facility] providers Does NOT accept Medicaid [facility] Parker, MD; Hunter, MD; Wallace, DO [address] [place], [place] [address] [phone] Mon-Fri 8:00-5:00 Babies seen by [facility] providers Does NOT accept Medicaid [facility] Brandon, PA; Brecken, PA; Christy, NP; Dees, MD; DeClaire, MD; DeWeese, MD; Hansen, NP; Mills, NP; Parrish, NP; Smoot, NP; Summer, MD; Vapne,  MD [address] [place], [place] [address] [phone] Mon-Fri 8:30-5:00, Sat 10:00-1:00 Providers come to see babies at [facility] Does NOT accept Medicaid Free prenatal information session Tuesdays at 4:45pm [facility] Bouska, MD; Gordon, PA; Jeffery, PA; Weber, PA [address] [place] [address] [phone] Mon-Fri 7:30-5:30 Babies seen by [facility] providers [facility] [address], [place], [place]  [address] [phone]   Fax - [phone]  [place] ([address] & [address]) [facility] [place] [address] [place], [place] [address] [phone] Mon-Thur 8:00-6:00 Providers come to see babies at [facility] Accepting Medicaid [facility] Anderson, NP; Badger, MD; Beal, PA; Spencer, PA [address] [place], [place] [address] [phone] Mon-Thur 7:30-7:30, Fri 7:30-4:30 Babies seen by [facility] providers Accepting Medicaid [facility] Agbuya, MD; Klett, NP; Romgoolam, MD [address], [place], [place] [address] [phone] Mon-Fri 8:30-5:00, Sat 8:30-12:00 Providers come to see babies at [facility] Accepting Medicaid Must have "Meet & Greet" appointment at office prior to delivery [facility] ([facility]) McCord, MD; Wallace, MD; Wood, MD [address], [place], [place] [address] [phone] Mon-Wed 8:00-6:00, Thur-Fri 8:00-5:00, Sat 9:00-12:00 Providers come to see babies at [facility] Does NOT accept Medicaid Only accepting siblings of current patients [facility]  [address], [place], [place]  [address] [phone]   Fax - [phone] [facility] [address], [place], [place]  [address] [phone]   Fax -  [phone]  [place] ([address] & [address])  [facility] Cirigliano, DO; Matthews, DO [address] , [place] [address] [phone] Mon-Fri 7:00-5:00 Babies seen by [facility] providers Does NOT accept Medicaid [facility] Briscoe, MD; Howley, PA; Moreira, PA [address], [place], [place] [address] [phone] Mon-Fri 8:00-5:00 Babies seen by [facility] providers Accepting Medicaid [facility] Boyd, MD; Church, PA; Jones, NP; Osborn, PA [address], ,  [address] [phone] Mon-Fri 8:00-5:00 Babies seen by providers at [facility] Accepting Medicaid  [place] ([address]) [facility] [place] Wendling, DO [address] [place], [place] [address] [phone] Mon-Fri 8:00-5:00 Babies seen by [facility] providers Does NOT accept Medicaid Limited availability, please call early in hospitalization to schedule follow-up [facility] Calderon, PA; Cummings, MD; Dillard, MD; Martin, PA; Olson, MD; VanDeven, PA [address], [place], [place] [address] [phone] Mon-Fri 8:30-5:00, Sat 9:00-12:00 Babies seen by providers at [facility] Accepting Medicaid Please register online then schedule online or call office [email or web address] Hunter, NP; Kumar, MD; Martin Rogers, PA [address], [place], [place] [address] [phone] Mon-Fri 8:00-5:00 Babies seen by providers at [facility] Accepting Medicaid [facility] ([facility]) Danvers, MD; Kristi Fleenor, NP; West, MD [address], [place], [place] [address] [phone] Mon-Fri 8:00-5:30, Sat&Sun by appointment (phones open at  8:30) Babies seen by [facility] providers Accepting Medicaid Must be a first-time baby or sibling of current patient [facility] [place]  [address], [place], [place]  [address] [phone]   Fax - [phone]  [place] ([address]) [facility] Brown, PA; Cowen, PA; Rice, MD; Helton, PA; Spry, MD [address] [place], [place] [address] [phone] Mon-Thur 8:00-7:00, Fri 8:00-5:00, Sat 8:00-12:00, Sun 9:00-12:00 Babies seen by [facility] providers Accepting Medicaid [facility] Coe-Goins, MD; Marshall, MD; Pierre-Louis, MD [address] [place], [place], [place] [address] [phone] Mon-Thur 8:00-5:00 Babies seen by providers at [facility] Accepting Medicaid [facility] Bratton, MD; Coe-Goins, MD; Hayes, MD; Lewis, MD; List, MD; Lott, MD; Marshall, MD; Moran, MD; O'Thurlow Gallaga, MD; Pierre-Louis, MD; Pitonzo, MD; Scholer, MD; Spangle, MD [address] [place], [place] [address] [phone] Mon-Fri 8:00-5:30, Sat (Oct.-Mar.) 9:00-1:00 Babies seen by providers at [facility] Accepting Medicaid Must fill out new patient packet, available online at [email or web address] [facility] ([facility]) Friddle, NP; Harris, NP; Kelly, NP; Logan, MD; Melvin, PA; Poth, MD; Ramadoss, MD; Stanton, NP [address], [place], [place] [address] [phone] Mon-Thur 8:00-5:30, Fri 8:00-5:00 Babies seen by providers at [facility] Accepting Medicaid  [place] ([address]) [facility] Dixon, PA; [place], MD; Pickard, MD; Tapia, PA [address], [place], [place] [address] [phone] Mon-Fri 8:00-5:00 Babies seen by providers at [facility] Accepting Medicaid   [place] ([address]) [facility] [place] Masneri, DO; Meyers, MD; Nelson, PA [address], [place], [place] [address] [phone] Mon-Fri 8:00-5:00 Babies seen by providers at [facility] Does NOT accept Medicaid Limited appointment availability, please call early in hospitalization  [facility] [place] Kunedd, DO; McGowen, MD [address], [place], [place] [address] [phone] Mon-Fri 8:00-5:00 Babies seen by [facility] providers Does NOT accept Medicaid [facility] [place] Cameron, MD; MacDonald, MD; Michaels, PA; Nayak, MD [address] [place] [address], [place], [place] [address] [phone] Mon-Fri 8:00-5:00 After hours clinic ([address] [place], [place] [address]) [phone] Mon-Fri 5:00-8:00, Sat 12:00-6:00, Sun 10:00-4:00 Babies seen by [facility] providers Accepting Medicaid [facility] [place] [address], [place], [place]  [address] [phone]   Fax - [phone]  [place] ([address]) [facility] [place] [facility] Andy, MD [address], [place], [place] [address] [phone] Mon-Fri 8:00-5:00 Babies seen by [facility] providers Does NOT accept Medicaid [facility] [place] ([facility] [place]) Eksir, MD [address], [place], [place] [address] [phone] Mon-Thur 8:00-7:00, Fri 8:00-5:00, Sat 8:00-12:00 Babies seen by providers at [facility] Accepting Medicaid - but does not have vaccinations in office (must be received elsewhere) Limited availability, please call early in hospitalization  [place] ([address]) [facility]  Charlene Flemming, MD [address], [place] [address] [phone]  Fax [phone]  [facility]  Kimberly Newton, MD, Annamarie Streilein, PA, Carla Hampton, PA [address] [place], [place]  [address] [phone] [facility]  [address], [place], [place] [address] [phone] [address], [place], [place] [address] [phone] (West Office)  [facility] [address], [place], [place] [address] [phone] [facility] [address], [place], [place] [address] [phone] [facility] [address], [place], [place] [address] [phone] [facility] [address], [place], [place] [address] [phone] [facility] [address], [place], [place] [address] [phone] [facility] [address], [place], [place] [address] [phone] [facility]  [address], [place], [place] [address] [phone] Dr[facility] [address], [place], [place] [address] [phone] [facility] [address], [place], [place] [address] [phone] [facility] [address], ,  [address] [phone]  

## 2021-06-27 ENCOUNTER — Ambulatory Visit (INDEPENDENT_AMBULATORY_CARE_PROVIDER_SITE_OTHER): Payer: Medicaid Other | Admitting: Family Medicine

## 2021-06-27 ENCOUNTER — Other Ambulatory Visit: Payer: Self-pay

## 2021-06-27 ENCOUNTER — Encounter: Payer: Medicaid Other | Admitting: Nurse Practitioner

## 2021-06-27 ENCOUNTER — Other Ambulatory Visit (HOSPITAL_COMMUNITY)
Admission: RE | Admit: 2021-06-27 | Discharge: 2021-06-27 | Disposition: A | Discharge status: Home / Self Care | Payer: Medicaid Other | Source: Ambulatory Visit | Attending: Nurse Practitioner | Admitting: Nurse Practitioner

## 2021-06-27 VITALS — BP 115/80 | HR 92 | Wt 203.4 lb

## 2021-06-27 DIAGNOSIS — Z3480 Encounter for supervision of other normal pregnancy, unspecified trimester: Secondary | ICD-10-CM | POA: Insufficient documentation

## 2021-06-27 MED ORDER — ONDANSETRON 4 MG PO TBDP: 4.0000 mg | ORAL_TABLET | Freq: Three times a day (TID) | ORAL | 5 refills | Status: DC | PRN

## 2021-06-27 MED ORDER — PROMETHAZINE HCL 12.5 MG PO TABS: 12.5000 mg | ORAL_TABLET | Freq: Four times a day (QID) | ORAL | 5 refills | Status: DC | PRN

## 2021-06-27 MED ORDER — ASPIRIN EC 81 MG PO TBEC: 81.0000 mg | DELAYED_RELEASE_TABLET | Freq: Every day | ORAL | 11 refills | Status: DC

## 2021-06-27 NOTE — Progress Notes (Signed)
Subjective:   Felicia Lucas is a 26 y.o. G2P0010 at [redacted]w[redacted]d by LMP being seen today for her first obstetrical visit.  Her obstetrical history is significant for  n/a . Patient does intend to breast feed. Pregnancy history fully reviewed.  Patient reports no complaints.  HISTORY: OB History  Gravida Para Term Preterm AB Living  2 0 0 0 1 0  SAB IAB Ectopic Multiple Live Births  0 0 1 0 0    # Outcome Date GA Lbr Len/2nd Weight Sex Delivery Anes PTL Lv  2 Current           1 Ectopic 12/2020             Last pap smear: No results found for: DIAGPAP, HPV, HPVHIGH   History reviewed. No pertinent past medical history. History reviewed. No pertinent surgical history. Family History  Problem Relation Age of Onset   ADD / ADHD Neg Hx    Social History   Tobacco Use   Smoking status: Former    Types: Cigarettes  Substance Use Topics   Alcohol use: Not Currently   Drug use: Not Currently    Types: Marijuana   Allergies  Allergen Reactions   Food Rash    Eggplant   Current Outpatient Medications on File Prior to Visit  Medication Sig Dispense Refill   prenatal vitamin w/FE, FA (PRENATAL 1 + 1) 27-1 MG TABS tablet Take 1 tablet by mouth daily at 12 noon. 30 tablet 11   No current facility-administered medications on file prior to visit.     Exam   Vitals:   06/27/21 1538  BP: 115/80  Pulse: 92  Weight: 203 lb 6.4 oz (92.3 kg)   Fetal Heart Rate (bpm): 161  Uterus:     Pelvic Exam: Perineum: no hemorrhoids, normal perineum   Vulva: normal external genitalia, no lesions   Vagina:  normal mucosa mild amount of white discharge but denies any symptoms   Cervix: no lesions and normal, pap smear done.   System: General: well-developed, well-nourished female in no acute distress   Skin: normal coloration and turgor, no rashes   Neurologic: oriented, normal, negative, normal mood   Extremities: normal strength, tone, and muscle mass, ROM of all joints is  normal   HEENT PERRLA, extraocular movement intact and sclera clear, anicteric   Neck supple and no masses   Respiratory:  no respiratory distress      Assessment:   Pregnancy: G2P0010 Patient Active Problem List   Diagnosis Date Noted   Supervision of other normal pregnancy, antepartum 06/20/2021     Plan:  1. Supervision of other normal pregnancy, antepartum BP and FHR normal Start baby ASA (meets two minor criteria) Initial labs drawn. Continue prenatal vitamins. Genetic Screening discussed, NIPS: ordered. Ultrasound discussed; fetal anatomic survey: ordered. Problem list reviewed and updated. The nature of [facility] clinic was explained to patient; Voiced they may need to be seen by other [facility] providers which includes family medicine physicians, OB GYNs, and APPs. Delivery will hopefully be with one of the [facility] providers or another [facility] physician and we cannot promise this at this time.  Discussed there are [facility] staff in the hospital 24-7 and they understand and support this model and there is a likelihood one of these providers will catch their baby.  We also discussed that the service includes learners (residents, student) and they will be involved in the care team.  Routine obstetric precautions  reviewed. Return in about 4 weeks (around 07/25/2021) for [facility] patient, ob visit.

## 2021-06-28 ENCOUNTER — Encounter: Payer: Self-pay | Admitting: *Deleted

## 2021-06-28 LAB — CBC/D/PLT+RPR+RH+ABO+RUBIGG...
Antibody Screen: NEGATIVE
Basophils Absolute: 0 10*3/uL (ref 0.0–0.2)
Basos: 0 %
EOS (ABSOLUTE): 0 10*3/uL (ref 0.0–0.4)
Eos: 1 %
HCV Ab: 0.3 s/co ratio (ref 0.0–0.9)
HIV Screen 4th Generation wRfx: NONREACTIVE
Hematocrit: 37.6 % (ref 34.0–46.6)
Hemoglobin: 13.3 g/dL (ref 11.1–15.9)
Hepatitis B Surface Ag: NEGATIVE
Immature Grans (Abs): 0 10*3/uL (ref 0.0–0.1)
Immature Granulocytes: 0 %
Lymphocytes Absolute: 1.3 10*3/uL (ref 0.7–3.1)
Lymphs: 21 %
MCH: 30.9 pg (ref 26.6–33.0)
MCHC: 35.4 g/dL (ref 31.5–35.7)
MCV: 87 fL (ref 79–97)
Monocytes Absolute: 0.5 10*3/uL (ref 0.1–0.9)
Monocytes: 8 %
Neutrophils Absolute: 4.1 10*3/uL (ref 1.4–7.0)
Neutrophils: 70 %
Platelets: 266 10*3/uL (ref 150–450)
RBC: 4.3 x10E6/uL (ref 3.77–5.28)
RDW: 13.1 % (ref 11.7–15.4)
RPR Ser Ql: NONREACTIVE
Rh Factor: POSITIVE
Rubella Antibodies, IGG: 6.71 index (ref >0.99)
WBC: 5.8 10*3/uL (ref 3.4–10.8)

## 2021-06-28 LAB — CYTOLOGY - PAP
Chlamydia: NEGATIVE
Comment: NEGATIVE
Comment: NEGATIVE
Comment: NEGATIVE
Comment: NORMAL
Diagnosis: NEGATIVE
High risk HPV: NEGATIVE
Neisseria Gonorrhea: NEGATIVE
Trichomonas: NEGATIVE

## 2021-06-28 LAB — HEMOGLOBIN A1C
Est. average glucose Bld gHb Est-mCnc: 97 mg/dL
Hgb A1c MFr Bld: 5 % (ref 4.8–5.6)

## 2021-06-28 LAB — HCV INTERPRETATION

## 2021-06-29 LAB — URINE CULTURE, OB REFLEX

## 2021-06-29 LAB — CULTURE, OB URINE

## 2021-07-03 ENCOUNTER — Telehealth: Payer: Self-pay | Admitting: Family Medicine

## 2021-07-03 NOTE — Telephone Encounter (Signed)
Patient called and stated that she works from home currently but in order to continue to work from home she needs a note from her provider. She said that he mentioned it at her pervious appointment as well.

## 2021-07-09 ENCOUNTER — Encounter: Payer: Self-pay | Admitting: Family Medicine

## 2021-07-18 ENCOUNTER — Encounter: Payer: Self-pay | Admitting: *Deleted

## 2021-07-25 ENCOUNTER — Other Ambulatory Visit: Payer: Self-pay

## 2021-07-25 ENCOUNTER — Ambulatory Visit (INDEPENDENT_AMBULATORY_CARE_PROVIDER_SITE_OTHER): Payer: Medicaid Other | Admitting: Family Medicine

## 2021-07-25 VITALS — BP 105/73 | HR 106 | Wt 203.4 lb

## 2021-07-25 DIAGNOSIS — Z3480 Encounter for supervision of other normal pregnancy, unspecified trimester: Secondary | ICD-10-CM

## 2021-07-25 NOTE — Progress Notes (Signed)
? ?  Subjective:  ?Felicia Lucas is a 26 y.o. G2P0010 at [redacted]w[redacted]d being seen today for ongoing prenatal care.  She is currently monitored for the following issues for this low-risk pregnancy and has Supervision of other normal pregnancy, antepartum on their problem list. ? ?Patient reports no complaints.  Contractions: Not present. Vag. Bleeding: None.  Movement: Present. Denies leaking of fluid.  ? ?The following portions of the patient's history were reviewed and updated as appropriate: allergies, current medications, past family history, past medical history, past social history, past surgical history and problem list. Problem list updated. ? ?Objective:  ? ?Vitals:  ? 07/25/21 1547  ?BP: 105/73  ?Pulse: (!) 106  ?Weight: 203 lb 6.4 oz (92.3 kg)  ? ? ?Fetal Status: Fetal Heart Rate (bpm): 151   Movement: Present    ? ?General:  Alert, oriented and cooperative. Patient is in no acute distress.  ?Skin: Skin is warm and dry. No rash noted.   ?Cardiovascular: Normal heart rate noted  ?Respiratory: Normal respiratory effort, no problems with respiration noted  ?Abdomen: Soft, gravid, appropriate for gestational age. Pain/Pressure: Absent     ?Pelvic: Vag. Bleeding: None     ?Cervical exam deferred        ?Extremities: Normal range of motion.  Edema: None  ?Mental Status: Normal mood and affect. Normal behavior. Normal judgment and thought content.  ? ?Urinalysis:     ? ?Assessment and Plan:  ?Pregnancy: G2P0010 at [redacted]w[redacted]d ? ?1. Supervision of other normal pregnancy, antepartum ?BP and FHR normal ?AFP today ?- AFP, Serum, Open Spina Bifida ? ?Preterm labor symptoms and general obstetric precautions including but not limited to vaginal bleeding, contractions, leaking of fluid and fetal movement were reviewed in detail with the patient. ?Please refer to After Visit Summary for other counseling recommendations.  ?Return in 4 weeks (on 08/22/2021) for [facility] patient, ob visit. ? ? ?Clarnce Flock, MD ? ?

## 2021-07-25 NOTE — Patient Instructions (Addendum)
Safe Medications in Pregnancy    Acne: Benzoyl Peroxide Salicylic Acid  Backache/Headache: Tylenol: 2 regular strength every 4 hours OR              2 Extra strength every 6 hours  Colds/Coughs/Allergies: Benadryl (alcohol free) 25 mg every 6 hours as needed Breath right strips Claritin Cepacol throat lozenges Chloraseptic throat spray Cold-Eeze- up to three times per day Cough drops, alcohol free Flonase (by prescription only) Guaifenesin Mucinex Robitussin DM (plain only, alcohol free) Saline nasal spray/drops Sudafed (pseudoephedrine) & Actifed ** use only after [redacted] weeks gestation and if you do not have high blood pressure Tylenol Vicks Vaporub Zinc lozenges Zyrtec   Constipation: Colace Ducolax suppositories Fleet enema Glycerin suppositories Metamucil Milk of magnesia Miralax Senokot Smooth move tea  Diarrhea: Kaopectate Imodium A-D  *NO pepto Bismol  Hemorrhoids: Anusol Anusol HC Preparation H Tucks  Indigestion: Tums Maalox Mylanta Zantac  Pepcid  Insomnia: Benadryl (alcohol free) 25mg  every 6 hours as needed Tylenol PM Unisom, no Gelcaps  Leg Cramps: Tums MagGel  Nausea/Vomiting:  Bonine Dramamine Emetrol Ginger extract Sea bands Meclizine  Nausea medication to take during pregnancy:  Unisom (doxylamine succinate 25 mg tablets) Take one tablet daily at bedtime. If symptoms are not adequately controlled, the dose can be increased to a maximum recommended dose of two tablets daily (1/2 tablet in the morning, 1/2 tablet mid-afternoon and one at bedtime). Vitamin B6 100mg  tablets. Take one tablet twice a day (up to 200 mg per day).  Skin Rashes: Aveeno products Benadryl cream or 25mg  every 6 hours as needed Calamine Lotion 1% cortisone cream  Yeast infection: Gyne-lotrimin 7 Monistat 7   **If taking multiple medications, please check labels to avoid duplicating the same active ingredients **take  medication as directed on the label ** Do not exceed 4000 mg of tylenol in 24 hours **Do not take medications that contain aspirin or ibuprofen    Contraception Choices Contraception, also called birth control, refers to methods or devices that prevent pregnancy. Hormonal methods Contraceptive implant A contraceptive implant is a thin, plastic tube that contains a hormone that prevents pregnancy. It is different from an intrauterine device (IUD). It is inserted into the upper part of the arm by a health care provider. Implants can be effective for up to 3 years. Progestin-only injections Progestin-only injections are injections of progestin, a synthetic form of the hormone progesterone. They are given every 3 months by a health care provider. Birth control pills Birth control pills are pills that contain hormones that prevent pregnancy. They must be taken once a day, preferably at the same time each day. A prescription is needed to use this method of contraception. Birth control patch The birth control patch contains hormones that prevent pregnancy. It is placed on the skin and must be changed once a week for three weeks and removed on the fourth week. A prescription is needed to use this method of contraception. Vaginal ring A vaginal ring contains hormones that prevent pregnancy. It is placed in the vagina for three weeks and removed on the fourth week. After that, the process is repeated with a new ring. A prescription is needed to use this method of contraception. Emergency contraceptive Emergency contraceptives prevent pregnancy after unprotected sex. They come in pill form and can be taken up to 5 days after sex. They work best the sooner they are taken after having sex. Most emergency contraceptives are available without a prescription. This method should not be used  as your only form of birth control. Barrier methods Female condom A female condom is a thin sheath that is worn over the  penis during sex. Condoms keep sperm from going inside a woman's body. They can be used with a sperm-killing substance (spermicide) to increase their effectiveness. They should be thrown away after one use. Female condom A female condom is a soft, loose-fitting sheath that is put into the vagina before sex. The condom keeps sperm from going inside a woman's body. They should be thrown away after one use. Diaphragm A diaphragm is a soft, dome-shaped barrier. It is inserted into the vagina before sex, along with a spermicide. The diaphragm blocks sperm from entering the uterus, and the spermicide kills sperm. A diaphragm should be left in the vagina for 6-8 hours after sex and removed within 24 hours. A diaphragm is prescribed and fitted by a health care provider. A diaphragm should be replaced every 1-2 years, after giving birth, after gaining more than 15 lb (6.8 kg), and after pelvic surgery. Cervical cap A cervical cap is a round, soft latex or plastic cup that fits over the cervix. It is inserted into the vagina before sex, along with spermicide. It blocks sperm from entering the uterus. The cap should be left in place for 6-8 hours after sex and removed within 48 hours. A cervical cap must be prescribed and fitted by a health care provider. It should be replaced every 2 years. Sponge A sponge is a soft, circular piece of polyurethane foam with spermicide in it. The sponge helps block sperm from entering the uterus, and the spermicide kills sperm. To use it, you make it wet and then insert it into the vagina. It should be inserted before sex, left in for at least 6 hours after sex, and removed and thrown away within 30 hours. Spermicides Spermicides are chemicals that kill or block sperm from entering the cervix and uterus. They can come as a cream, jelly, suppository, foam, or tablet. A spermicide should be inserted into the vagina with an applicator at least 10-15 minutes before sex to allow time for  it to work. The process must be repeated every time you have sex. Spermicides do not require a prescription. Intrauterine contraception Intrauterine device (IUD) An IUD is a T-shaped device that is put in a woman's uterus. There are two types: Hormone IUD.This type contains progestin, a synthetic form of the hormone progesterone. This type can stay in place for 3-5 years. Copper IUD.This type is wrapped in copper wire. It can stay in place for 10 years. Permanent methods of contraception Female tubal ligation In this method, a woman's fallopian tubes are sealed, tied, or blocked during surgery to prevent eggs from traveling to the uterus. Hysteroscopic sterilization In this method, a small, flexible insert is placed into each fallopian tube. The inserts cause scar tissue to form in the fallopian tubes and block them, so sperm cannot reach an egg. The procedure takes about 3 months to be effective. Another form of birth control must be used during those 3 months. Female sterilization This is a procedure to tie off the tubes that carry sperm (vasectomy). After the procedure, the man can still ejaculate fluid (semen). Another form of birth control must be used for 3 months after the procedure. Natural planning methods Natural family planning In this method, a couple does not have sex on days when the woman could become pregnant. Calendar method In this method, the woman keeps track of  the length of each menstrual cycle, identifies the days when pregnancy can happen, and does not have sex on those days. Ovulation method In this method, a couple avoids sex during ovulation. Symptothermal method This method involves not having sex during ovulation. The woman typically checks for ovulation by watching changes in her temperature and in the consistency of cervical mucus. Post-ovulation method In this method, a couple waits to have sex until after ovulation. Where to find more information Centers for  Disease Control and Prevention: FootballExhibition.com.br Summary Contraception, also called birth control, refers to methods or devices that prevent pregnancy. Hormonal methods of contraception include implants, injections, pills, patches, vaginal rings, and emergency contraceptives. Barrier methods of contraception can include female condoms, female condoms, diaphragms, cervical caps, sponges, and spermicides. There are two types of IUDs (intrauterine devices). An IUD can be put in a woman's uterus to prevent pregnancy for 3-5 years. Permanent sterilization can be done through a procedure for males and females. Natural family planning methods involve nothaving sex on days when the woman could become pregnant. This information is not intended to replace advice given to you by your health care provider. Make sure you discuss any questions you have with your health care provider. Document Revised: 10/18/2019 Document Reviewed: 10/18/2019 Elsevier Patient Education  2022 [employer].

## 2021-07-27 LAB — AFP, SERUM, OPEN SPINA BIFIDA
AFP MoM: 1.02
AFP Value: 28.5 ng/mL
Gest. Age on Collection Date: 15 weeks
Maternal Age At EDD: 25.2 yr
OSBR Risk 1 IN: 10000
Test Results:: NEGATIVE
Weight: 203 [lb_av]

## 2021-08-14 ENCOUNTER — Telehealth: Payer: Self-pay

## 2021-08-14 NOTE — Telephone Encounter (Signed)
[employer] results received. Negative for 3/4 diseases. Unable to test for Spinal Muscular Atrophy. Will need redraw at next appt. Called pt and reviewed. ?

## 2021-08-22 ENCOUNTER — Ambulatory Visit (INDEPENDENT_AMBULATORY_CARE_PROVIDER_SITE_OTHER): Payer: Medicaid Other | Admitting: Family Medicine

## 2021-08-22 VITALS — BP 96/66 | HR 94 | Wt 209.5 lb

## 2021-08-22 DIAGNOSIS — Z1331 Encounter for screening for depression: Secondary | ICD-10-CM

## 2021-08-22 DIAGNOSIS — Z3480 Encounter for supervision of other normal pregnancy, unspecified trimester: Secondary | ICD-10-CM

## 2021-08-22 NOTE — Progress Notes (Signed)
? ?  Subjective:  ?Felicia Lucas is a 26 y.o. G2P0010 at [redacted]w[redacted]d being seen today for ongoing prenatal care.  She is currently monitored for the following issues for this low-risk pregnancy and has Supervision of other normal pregnancy, antepartum on their problem list. ?  ?Patient reports no complaints.  Contractions: Not present. Vag. Bleeding: None.  Movement: Present. Denies leaking of fluid.  ? ?The following portions of the patient's history were reviewed and updated as appropriate: allergies, current medications, past family history, past medical history, past social history, past surgical history and problem list. Problem list updated. ? ?Objective:  ? ?Vitals:  ? 08/22/21 1545  ?BP: 96/66  ?Pulse: 94  ?Weight: 209 lb 8 oz (95 kg)  ? ? ?Fetal Status: Fetal Heart Rate (bpm): 148   Movement: Present    ? ?General:  Alert, oriented and cooperative. Patient is in no acute distress.  ?Skin: Skin is warm and dry. No rash noted.   ?Cardiovascular: Normal heart rate noted  ?Respiratory: Normal respiratory effort, no problems with respiration noted  ?Abdomen: Soft, gravid, appropriate for gestational age. Pain/Pressure: Absent     ?Pelvic: Vag. Bleeding: None     ?Cervical exam deferred        ?Extremities: Normal range of motion.  Edema: None  ?Mental Status: Normal mood and affect. Normal behavior. Normal judgment and thought content.  ? ?Urinalysis:     ? ?Assessment and Plan:  ?Pregnancy: G2P0010 at [redacted]w[redacted]d ? ?1. Supervision of other normal pregnancy, antepartum ?BP and FHR normal ?Has anatomy scan scheduled for later this week ?Redraw done of [facility] as no result given for SMA ? ?Preterm labor symptoms and general obstetric precautions including but not limited to vaginal bleeding, contractions, leaking of fluid and fetal movement were reviewed in detail with the patient. ?Please refer to After Visit Summary for other counseling recommendations.  ?Return in 4 weeks (on 09/19/2021). ? ? ?Venora Maples,  MD ? ?

## 2021-08-22 NOTE — Patient Instructions (Signed)

## 2021-08-23 NOTE — BH Specialist Note (Signed)
Integrated Behavioral Health via Telemedicine Visit ? ?08/28/2021 ?Epimenio Foot Marijean Heath ?[id number] ? ?Number of Integrated Behavioral Health Clinician visits: 1- Initial Visit ? ?Session Start time: 1420 ?  ?Session End time: 1439 ? ?Total time in minutes: 19 ? ? ?Referring Provider: Merian Capron, MD ?Patient/Family location: [place] ?[facility] Provider location: [facility] for [employer] [facility] ? ?All persons participating in visit: Patient Felicia Lucas and [facility] Shober  ? ?Types of Service: Individual psychotherapy and Video visit ? ?I connected with Gerrit Friends and/or Milagros Loll Dymek's  n/a  via  Telephone or Video Enabled Telemedicine Application  (Video is Caregility application) and verified that I am speaking with the correct person using two identifiers. Discussed confidentiality: Yes  ? ?I discussed the limitations of telemedicine and the availability of in person appointments.  Discussed there is a possibility of technology failure and discussed alternative modes of communication if that failure occurs. ? ?I discussed that engaging in this telemedicine visit, they consent to the provision of behavioral healthcare and the services will be billed under their insurance. ? ?Patient and/or legal guardian expressed understanding and consented to Telemedicine visit: Yes  ? ?Presenting Concerns: ?Patient and/or family reports the following symptoms/concerns: Lack of quality sleep, difficulty finding new housing; adjusting to current pregnancy. ?Duration of problem: Current pregnancy; Severity of problem: moderate ? ?Patient and/or Family's Strengths/Protective Factors: ?Social connections, Concrete supports in place (healthy food, safe environments, etc.), Sense of purpose, and Physical Health (exercise, healthy diet, medication compliance, etc.) ? ?Goals Addressed: ?Patient will: ? Reduce symptoms of: stress  ? Increase knowledge and/or ability of: healthy habits  ?  Demonstrate ability to: Increase healthy adjustment to current life circumstances ? ?Progress towards Goals: ?Ongoing ? ?Interventions: ?Interventions utilized:  Psychoeducation and/or Health Education and Link to [facility] ?Standardized Assessments completed:  PHQ9/GAD7 given in past two weeks ? ?Patient and/or Family Response: Patient agrees with treatment plan. ? ? ?Assessment: ?Patient currently experiencing Psychosocial stress and Other specified counseling.  ? ?Patient may benefit from psychoeducation and brief therapeutic interventions regarding coping with symptoms of current life stress ?. ? ?Plan: ?Follow up with behavioral health clinician on : Call Pollie Poma at [phone], as needed. ?Behavioral recommendations:  ?-Continue taking prenatal vitamin daily ?-Continue prioritizing healthy self-care (healthy meals and sleep; nap when able on days off work) ?-Accept WIC referral ?-Read through resources on After Visit Summary; use as needed ?Referral(s): Integrated Art gallery manager (In Clinic) and [facility]:  Food, Housing, and childcare; postpartum planner ? ?I discussed the assessment and treatment plan with the patient and/or parent/guardian. They were provided an opportunity to ask questions and all were answered. They agreed with the plan and demonstrated an understanding of the instructions. ?  ?They were advised to call back or seek an in-person evaluation if the symptoms worsen or if the condition fails to improve as anticipated. ? ?Rae Lips, LCSW ? ? ?  08/22/2021  ?  3:58 PM 07/25/2021  ?  3:56 PM 06/29/2021  ? 10:35 AM  ?Depression screen PHQ 2/9  ?Decreased Interest 0 2 3  ?Down, Depressed, Hopeless 0 0 2  ?PHQ - 2 Score 0 2 5  ?Altered sleeping 3 3 2   ?Tired, decreased energy 3 2 3   ?Change in appetite 2 0 3  ?Feeling bad or failure about yourself  0 0 0  ?Trouble concentrating 2 0 1  ?Moving slowly or fidgety/restless 0 0 1  ?Suicidal thoughts 0 0  0  ?PHQ-9 Score 10  7 15   ?Difficult doing work/chores Not difficult at all    ? ? ?  08/22/2021  ?  3:58 PM 07/25/2021  ?  3:56 PM 06/29/2021  ? 10:35 AM  ?GAD 7 : Generalized Anxiety Score  ?Nervous, Anxious, on Edge 0 0 0  ?Control/stop worrying 1 0 0  ?Worry too much - different things 1 0 1  ?Trouble relaxing 0 2 2  ?Restless 1 0 1  ?Easily annoyed or irritable 0 3 3  ?Afraid - awful might happen 0 0 0  ?Total GAD 7 Score 3 5 7   ?Anxiety Difficulty Not difficult at all    ? ? ? ?

## 2021-08-23 NOTE — Addendum Note (Signed)
Addended by: Vesta Mixer C on: 08/23/2021 08:59 AM ? ? Modules accepted: Orders ? ?

## 2021-08-24 ENCOUNTER — Ambulatory Visit: Payer: Medicaid Other | Attending: Family Medicine

## 2021-08-24 ENCOUNTER — Encounter: Payer: Self-pay | Admitting: *Deleted

## 2021-08-24 ENCOUNTER — Other Ambulatory Visit: Payer: Self-pay | Admitting: *Deleted

## 2021-08-24 ENCOUNTER — Ambulatory Visit: Payer: Medicaid Other | Admitting: *Deleted

## 2021-08-24 VITALS — BP 107/68 | HR 88

## 2021-08-24 DIAGNOSIS — Z363 Encounter for antenatal screening for malformations: Secondary | ICD-10-CM | POA: Diagnosis present

## 2021-08-24 DIAGNOSIS — O99212 Obesity complicating pregnancy, second trimester: Secondary | ICD-10-CM | POA: Diagnosis not present

## 2021-08-24 DIAGNOSIS — Z3689 Encounter for other specified antenatal screening: Secondary | ICD-10-CM

## 2021-08-24 DIAGNOSIS — Z3A19 19 weeks gestation of pregnancy: Secondary | ICD-10-CM | POA: Diagnosis not present

## 2021-08-24 DIAGNOSIS — Z3480 Encounter for supervision of other normal pregnancy, unspecified trimester: Secondary | ICD-10-CM

## 2021-08-28 ENCOUNTER — Ambulatory Visit (INDEPENDENT_AMBULATORY_CARE_PROVIDER_SITE_OTHER): Payer: Medicaid Other | Admitting: Clinical

## 2021-08-28 ENCOUNTER — Ambulatory Visit (INDEPENDENT_AMBULATORY_CARE_PROVIDER_SITE_OTHER): Payer: Medicaid Other

## 2021-08-28 ENCOUNTER — Other Ambulatory Visit (HOSPITAL_COMMUNITY)
Admission: RE | Admit: 2021-08-28 | Discharge: 2021-08-28 | Disposition: A | Discharge status: Home / Self Care | Payer: Medicaid Other | Source: Ambulatory Visit | Attending: Family Medicine | Admitting: Family Medicine

## 2021-08-28 VITALS — BP 104/68 | HR 87 | Wt 208.0 lb

## 2021-08-28 DIAGNOSIS — Z658 Other specified problems related to psychosocial circumstances: Secondary | ICD-10-CM

## 2021-08-28 DIAGNOSIS — Z7189 Other specified counseling: Secondary | ICD-10-CM

## 2021-08-28 DIAGNOSIS — N898 Other specified noninflammatory disorders of vagina: Secondary | ICD-10-CM | POA: Insufficient documentation

## 2021-08-28 DIAGNOSIS — Z3480 Encounter for supervision of other normal pregnancy, unspecified trimester: Secondary | ICD-10-CM

## 2021-08-28 NOTE — Patient Instructions (Addendum)
Center for [employer] [facility] ?[address] ?[place], [place] [address] ?[phone] (main office) ?[phone] (Javious Hallisey's office) ? ?www.conehealthybaby.com (video tour of hospital; register for childbirth classes, etc.) ? ?[facility]  ?(Childcare options, Early childcare development, etc.) ?www.SemiTrust.tn ? ?Housing Resources  ? ?[employer] ?[address] [place], [place] [address] ?([phone] ?PhoneCaptions.ch ?**Programs include: Foreclosure Prevention and Housing Counseling, Healthy Radiographer, therapeutic, Homeless Prevention and Housing Assistance ? ?[employer] ?[address], [place], [place] [address] ?(586-046-4039 ?https://chshousing.org ?**Home Ownership/Affordable Housing Program and Home Repair Program ? ?[employer] ?[address], [place], [place] [address] ?[phone] ?www.TextRun.co.nz ?**home buyer education courses, foreclosure prevention ? ?[facility] ?[address] [place], [place] [address] ?([phone] ?DefMagazine.is ?**Environmental Exposure Assessment (investigation of homes where either children or pregnant women with a confirmed elevated blood lead level reside) ? ?[employer] ?[address] [place], [place] [address] ?([phone] ?https://www.self-help.org/locations/[place]-branch ?**Offers credit-building and banking services to people unable to use traditional banking ? ? ? ? ? ?BRAINSTORMING ? ?Develop a Plan ?Goals: ?Provide a way to start conversation about your new life with a baby ?Assist parents in recognizing and using resources within their reach ?Help pave the way before birth for an easier period of transition afterwards. ? ?Make a list of the following information to keep in a central location: ?Full name of Mom and Partner:  _____________________________________________ ?Baby's full name and Date of Birth: ___________________________________________ ?Home Address: ___________________________________________________________ ?________________________________________________________________________ ?Home Phone: ____________________________________________________________ ?Parents' cell numbers: _____________________________________________________ ?________________________________________________________________________ ?Name and contact info for OB: ______________________________________________ ?Name and contact info for Pediatrician:________________________________________ ?Contact info for Lactation Consultants: ________________________________________ ? ?REST and SLEEP ?*You each need at least 4-5 hours of uninterrupted sleep every day. Write specific names and contact information.* ?How are you going to rest in the postpartum period? While partner's home? When partner returns to work? When you both return to work? ?Where will your baby sleep? ?Who is available to help during the day? Evening? Night? ?Who could move in for a period to help support you? ?What are some ideas to help you get enough sleep? ?__________________________________________________________________________________________________________________________________________________________________________________________________________________________________________ ?NUTRITIOUS FOOD AND DRINK ?*Plan for meals before your baby is born so you can have healthy food to eat during the immediate postpartum period.* ?Who will look after breakfast? Lunch? Dinner? List names and contact information. Brainstorm quick, healthy ideas for each meal. ?What can you do before baby is born to prepare meals for the postpartum period? ?How can others help you with meals? ?Which grocery stores provide online shopping and delivery? ?Which restaurants offer take-out or delivery  options? ?______________________________________________________________________________________________________________________________________________________________________________________________________________________________________________________________________________________________________________________________________________________________________________________________________ ? ?CARE FOR MOM ?*It's important that mom is cared for and pampered in the postpartum period. Remember, the most important ways new mothers need care are: sleep, nutrition, gentle exercise, and time off.* ?Who can come take care of mom during this period? Make a list of people with their contact information. ?List some activities that make you feel cared for, rested, and energized? Who can make sure you have opportunities to do these things? ?Does mom have a space of her very own within your home that's just for her? Make a ?[facility]? where she can be comfortable, rest, and renew herself daily. ?______________________________________________________________________________________________________________________________________________________________________________________________________________________________________________________________________________________________________________________________________________________________________________________________________ ? ? ? ?CARE FOR AND FEEDING BABY ?*Knowledgeable and encouraging people will offer the best support with regard to feeding your baby.* ?Educate yourself and choose the best feeding option for your baby. ?Make a list  of people who will guide, support, and be a resource for you as your care for and feed your baby. (Friends that have breastfed or are currently breastfeeding, lactation consultants, breastfeeding support groups, etc.) ?Consider a postpartum doula. (These websites can give you information: dona.org & https://shea.org/) ?Seek out local  breastfeeding resources like the breastfeeding support group at [employer] or [employer]. ?______________________________________________________________________________________________________________________________________________________________________________________________________________________________________________________________________________________________________________________________________________________________________________________________________ ? ?CHORES AND ERRANDS ?Who can help with a thorough cleaning before baby is born? ?Make a list of people who will help with housekeeping and chores, like laundry, light cleaning, dishes, bathrooms, etc. ?Who can run some errands for you? ?What can you do to make sure you are stocked with basic supplies before baby is born? ?Who is going to do the shopping? ?______________________________________________________________________________________________________________________________________________________________________________________________________________________________________________________________________________________________________________________________________________________________________________________________________ ? ? ? ? ?Family Adjustment ?*Nurture yourselves?it helps parents be more loving and allows for better bonding with their child.* ?What sorts of things do you and partner enjoy doing together? Which activities help you to connect and strengthen your relationship? Make a list of those things. Make a list of people whom you trust to care for your baby so you can have some time together as a couple. ?What types of things help partner feel connected to Mom? Make a list. ?What needs will partner have in order to bond with baby? ?Other children? Who will care for them when you go into labor and while you are in the hospital? ?Think about what the needs of your older children might be. Who can help you meet those  needs? In what ways are you helping them prepare for bringing baby home? List some specific strategies you have for family adjustment. ?_______________________________________________________________________________________________

## 2021-08-28 NOTE — Progress Notes (Signed)
Patient here today with complaint of malodorous vaginal discharge. I instructed patient on how to collect a self swab. Self swab collected without issue. I informed patient we will notify her with any abnormal results. Patient denies any other concerns or questions.  ? ?Felicia Richards, RN ?08/28/21 ?

## 2021-08-29 ENCOUNTER — Other Ambulatory Visit: Payer: Self-pay | Admitting: Family Medicine

## 2021-08-29 DIAGNOSIS — B379 Candidiasis, unspecified: Secondary | ICD-10-CM

## 2021-08-29 DIAGNOSIS — B9689 Other specified bacterial agents as the cause of diseases classified elsewhere: Secondary | ICD-10-CM

## 2021-08-29 LAB — CERVICOVAGINAL ANCILLARY ONLY
Bacterial Vaginitis (gardnerella): POSITIVE — AB
Candida Glabrata: NEGATIVE
Candida Vaginitis: POSITIVE — AB
Chlamydia: NEGATIVE
Comment: NEGATIVE
Comment: NEGATIVE
Comment: NEGATIVE
Comment: NEGATIVE
Comment: NEGATIVE
Comment: NORMAL
Neisseria Gonorrhea: NEGATIVE
Trichomonas: NEGATIVE

## 2021-08-29 MED ORDER — TERCONAZOLE 0.4 % VA CREA: 1.0000 | TOPICAL_CREAM | Freq: Every day | VAGINAL | 0 refills | Status: DC

## 2021-08-29 MED ORDER — METRONIDAZOLE 500 MG PO TABS: 500.0000 mg | ORAL_TABLET | Freq: Two times a day (BID) | ORAL | 0 refills | Status: AC

## 2021-09-03 ENCOUNTER — Encounter: Payer: Self-pay | Admitting: Family Medicine

## 2021-09-03 DIAGNOSIS — O283 Abnormal ultrasonic finding on antenatal screening of mother: Secondary | ICD-10-CM | POA: Insufficient documentation

## 2021-09-05 ENCOUNTER — Telehealth: Payer: Self-pay | Admitting: Lactation Services

## 2021-09-05 NOTE — Telephone Encounter (Signed)
PA completed for Terconazole and approved.  ? ?Called Pharmacy to inform them PA approved and called patient and let her know. She will pick up tomorrow. ?

## 2021-09-10 ENCOUNTER — Encounter: Payer: Medicaid Other | Admitting: Family Medicine

## 2021-09-18 ENCOUNTER — Encounter (HOSPITAL_COMMUNITY): Payer: Self-pay | Admitting: Obstetrics and Gynecology

## 2021-09-18 ENCOUNTER — Other Ambulatory Visit: Payer: Self-pay

## 2021-09-18 ENCOUNTER — Inpatient Hospital Stay (HOSPITAL_COMMUNITY)
Admission: AD | Admit: 2021-09-18 | Discharge: 2021-09-18 | Disposition: A | Discharge status: Home / Self Care | Payer: Medicaid Other | Attending: Obstetrics and Gynecology | Admitting: Obstetrics and Gynecology

## 2021-09-18 DIAGNOSIS — R102 Pelvic and perineal pain: Secondary | ICD-10-CM

## 2021-09-18 DIAGNOSIS — O23592 Infection of other part of genital tract in pregnancy, second trimester: Secondary | ICD-10-CM | POA: Insufficient documentation

## 2021-09-18 DIAGNOSIS — O26899 Other specified pregnancy related conditions, unspecified trimester: Secondary | ICD-10-CM | POA: Diagnosis not present

## 2021-09-18 DIAGNOSIS — Z3A22 22 weeks gestation of pregnancy: Secondary | ICD-10-CM | POA: Insufficient documentation

## 2021-09-18 DIAGNOSIS — B3731 Acute candidiasis of vulva and vagina: Secondary | ICD-10-CM | POA: Diagnosis not present

## 2021-09-18 DIAGNOSIS — O26892 Other specified pregnancy related conditions, second trimester: Secondary | ICD-10-CM | POA: Diagnosis present

## 2021-09-18 LAB — URINALYSIS, ROUTINE W REFLEX MICROSCOPIC
Bilirubin Urine: NEGATIVE
Glucose, UA: 50 mg/dL — AB
Hgb urine dipstick: NEGATIVE
Ketones, ur: NEGATIVE mg/dL
Nitrite: NEGATIVE
Protein, ur: NEGATIVE mg/dL
Specific Gravity, Urine: 1.02 (ref 1.005–1.030)
pH: 6 (ref 5.0–8.0)

## 2021-09-18 NOTE — MAU Provider Note (Signed)
?History  ?  ? ?[id number] ? ?Arrival date and time: 09/18/21 1631 ?  ? ?Chief Complaint  ?Patient presents with  ? Abdominal Pain  ? Pelvic Pain  ? Vaginal Pain  ? ? ? ?HPI ?Felicia Lucas is a 26 y.o. at [redacted]w[redacted]d by LMP who presents for pelvic pain & vaginal pain. ?Symptoms started last week but worsened today. Reports sharp pain in her lower abdomen, pelvis, & vagina that occur when she's walking and moving. No pain while sitting in MAU. Denies bowel issues, dysuria, vaginal bleeding, or LOF. Has thick white vaginal discharge and vaginal irritation. Was diagnosed with a yeast infection earlier this month but only picked up the treatment last week & hasn't taken it consistently.   ? ?OB History   ? ? Gravida  ?2  ? Para  ?   ? Term  ?   ? Preterm  ?   ? AB  ?1  ? Living  ?   ?  ? ? SAB  ?   ? IAB  ?   ? Ectopic  ?1  ? Multiple  ?   ? Live Births  ?   ?   ?  ?  ? ? ?History reviewed. No pertinent past medical history. ? ?History reviewed. No pertinent surgical history. ? ?Family History  ?Problem Relation Age of Onset  ? ADD / ADHD Neg Hx   ? Diabetes Neg Hx   ? Hypertension Neg Hx   ? ? ?Allergies  ?Allergen Reactions  ? Food Rash  ?  Eggplant  ? ? ?No current facility-administered medications on file prior to encounter.  ? ?Current Outpatient Medications on File Prior to Encounter  ?Medication Sig Dispense Refill  ? ondansetron (ZOFRAN-ODT) 4 MG disintegrating tablet Take 1 tablet (4 mg total) by mouth every 8 (eight) hours as needed for nausea or vomiting. 30 tablet 5  ? prenatal vitamin w/FE, FA (PRENATAL 1 + 1) 27-1 MG TABS tablet Take 1 tablet by mouth daily at 12 noon. 30 tablet 11  ? terconazole (TERAZOL 7) 0.4 % vaginal cream Place 1 applicator vaginally at bedtime. 45 g 0  ? aspirin EC 81 MG tablet Take 1 tablet (81 mg total) by mouth daily. Swallow whole. (Patient not taking: Reported on 08/22/2021) 30 tablet 11  ? promethazine (PHENERGAN) 12.5 MG tablet Take 1 tablet (12.5 mg total) by mouth every 6  (six) hours as needed for nausea or vomiting. 30 tablet 5  ? ? ? ?ROS ?Pertinent positives and negative per HPI, all others reviewed and negative ? ?Physical Exam  ? ?BP 106/67 (BP Location: Left Arm)   Pulse 96   Temp 98.9 ?F (37.2 ?C) (Oral)   Resp 17   Ht 5\' 4"  (1.626 m)   Wt 98.4 kg   LMP 04/11/2021   SpO2 100%   BMI 37.25 kg/m?  ? ?Patient Vitals for the past 24 hrs: ? BP Temp Temp src Pulse Resp SpO2 Height Weight  ?09/18/21 1807 -- -- -- -- -- -- -- 98.4 kg  ?09/18/21 1804 106/67 -- -- 96 -- -- -- --  ?09/18/21 1715 106/68 -- -- 94 -- -- -- --  ?09/18/21 1703 (!) 105/59 98.9 ?F (37.2 ?C) Oral 98 17 100 % -- --  ?09/18/21 0700 -- -- -- -- -- -- 5\' 4"  (1.626 m) --  ? ? ?Physical Exam ?Vitals and nursing note reviewed. Exam conducted with a chaperone present.  ?Constitutional:   ?   General: She  is not in acute distress. ?   Appearance: She is well-developed.  ?Eyes:  ?   General: No scleral icterus. ?   Extraocular Movements: Extraocular movements intact.  ?Pulmonary:  ?   Effort: Pulmonary effort is normal. No respiratory distress.  ?Abdominal:  ?   Palpations: Abdomen is soft.  ?   Tenderness: There is no abdominal tenderness.  ?Skin: ?   General: Skin is warm and dry.  ?Neurological:  ?   Mental Status: She is alert.  ?Psychiatric:     ?   Mood and Affect: Mood normal.     ?   Behavior: Behavior normal.  ?  ? ?Cervical Exam ?Dilation: Closed ?Effacement (%): Thick ?Cervical Position: Posterior ?Exam by:: Judeth Horn NP ? ?Labs ?Results for orders placed or performed during the hospital encounter of 09/18/21 (from the past 24 hour(s))  ?Urinalysis, Routine w reflex microscopic Urine, Clean Catch     Status: Abnormal  ? Collection Time: 09/18/21  4:57 PM  ?Result Value Ref Range  ? Color, Urine YELLOW YELLOW  ? APPearance HAZY (A) CLEAR  ? Specific Gravity, Urine 1.020 1.005 - 1.030  ? pH 6.0 5.0 - 8.0  ? Glucose, UA 50 (A) NEGATIVE mg/dL  ? Hgb urine dipstick NEGATIVE NEGATIVE  ? Bilirubin Urine  NEGATIVE NEGATIVE  ? Ketones, ur NEGATIVE NEGATIVE mg/dL  ? Protein, ur NEGATIVE NEGATIVE mg/dL  ? Nitrite NEGATIVE NEGATIVE  ? Leukocytes,Ua SMALL (A) NEGATIVE  ? RBC / HPF 0-5 0 - 5 RBC/hpf  ? WBC, UA 0-5 0 - 5 WBC/hpf  ? Bacteria, UA FEW (A) NONE SEEN  ? Squamous Epithelial / LPF 11-20 0 - 5  ? Mucus PRESENT   ? ? ?Imaging ?No results found. ? ?MAU Course  ?Procedures ?Lab Orders    ?     Urinalysis, Routine w reflex microscopic Urine, Clean Catch    ?No orders of the defined types were placed in this encounter. ? ?Imaging Orders  ?No imaging studies ordered today  ? ? ?MDM ?FHT present via doppler ?Abdomen soft & non tender. Cervix closed/thick. Description of pain consistent with round ligament pain. Reviewed maternity support belt use.  ? ?Assessment and Plan  ? ?1. Pain of round ligament affecting pregnancy, antepartum   ?2. [redacted] weeks gestation of pregnancy   ? ?-Recommended maternity support belt ?-reviewed reasons to return to MAU ? ? ?Judeth Horn, NP ?09/18/21 ?6:23 PM ? ? ?

## 2021-09-18 NOTE — MAU Note (Signed)
.  Felicia Lucas is a 26 y.o. at [redacted]w[redacted]d here in MAU reporting:Sharp pelvic, vaginal and bilateral lower abdominal pain for one week that became worse yesterday. Patient states walking increases her pain. Patient denies vaginal bleeding but endorses white discharge. She states she was dx with a yeast infection two weeks ago and she states she started her terazol cream one week ago but has not taken it consistently. Pt endorses vaginal odor and light vaginal itching.  ? ?Pain score:  ?8/10 lower abdomen ?8/10 pelvis ?8/10 vagina ? ?FHT: 142 doppler ?Lab orders placed from triage: UA ? ?

## 2021-09-20 ENCOUNTER — Ambulatory Visit: Payer: Medicaid Other | Admitting: *Deleted

## 2021-09-20 ENCOUNTER — Ambulatory Visit: Payer: Medicaid Other | Attending: Obstetrics

## 2021-09-20 ENCOUNTER — Encounter: Payer: Medicaid Other | Admitting: Family Medicine

## 2021-09-20 VITALS — BP 106/66 | HR 83

## 2021-09-20 DIAGNOSIS — Z362 Encounter for other antenatal screening follow-up: Secondary | ICD-10-CM | POA: Diagnosis not present

## 2021-09-20 DIAGNOSIS — O99212 Obesity complicating pregnancy, second trimester: Secondary | ICD-10-CM | POA: Diagnosis not present

## 2021-09-20 DIAGNOSIS — O321XX0 Maternal care for breech presentation, not applicable or unspecified: Secondary | ICD-10-CM | POA: Diagnosis not present

## 2021-09-20 DIAGNOSIS — Z3A23 23 weeks gestation of pregnancy: Secondary | ICD-10-CM | POA: Diagnosis not present

## 2021-09-20 DIAGNOSIS — E669 Obesity, unspecified: Secondary | ICD-10-CM | POA: Diagnosis not present

## 2021-09-20 DIAGNOSIS — Z3480 Encounter for supervision of other normal pregnancy, unspecified trimester: Secondary | ICD-10-CM

## 2021-09-21 ENCOUNTER — Ambulatory Visit (INDEPENDENT_AMBULATORY_CARE_PROVIDER_SITE_OTHER): Payer: Medicaid Other | Admitting: Family Medicine

## 2021-09-21 ENCOUNTER — Encounter: Payer: Self-pay | Admitting: Family Medicine

## 2021-09-21 VITALS — BP 102/67 | HR 87 | Wt 214.8 lb

## 2021-09-21 DIAGNOSIS — O283 Abnormal ultrasonic finding on antenatal screening of mother: Secondary | ICD-10-CM

## 2021-09-21 DIAGNOSIS — Z3480 Encounter for supervision of other normal pregnancy, unspecified trimester: Secondary | ICD-10-CM

## 2021-09-21 NOTE — Progress Notes (Signed)
? ?  Subjective:  ?Felicia Lucas is a 26 y.o. G2P0010 at [redacted]w[redacted]d being seen today for ongoing prenatal care.  She is currently monitored for the following issues for this low-risk pregnancy and has Supervision of other normal pregnancy, antepartum and Echogenic intracardiac focus of fetus on prenatal ultrasound on their problem list. ? ?Patient reports no complaints.  Contractions: Not present. Vag. Bleeding: None.  Movement: Present. Denies leaking of fluid.  ? ?The following portions of the patient's history were reviewed and updated as appropriate: allergies, current medications, past family history, past medical history, past social history, past surgical history and problem list. Problem list updated. ? ?Objective:  ? ?Vitals:  ? 09/21/21 1146  ?BP: 102/67  ?Pulse: 87  ?Weight: 214 lb 12.8 oz (97.4 kg)  ? ? ?Fetal Status: Fetal Heart Rate (bpm): 140   Movement: Present    ? ?General:  Alert, oriented and cooperative. Patient is in no acute distress.  ?Skin: Skin is warm and dry. No rash noted.   ?Cardiovascular: Normal heart rate noted  ?Respiratory: Normal respiratory effort, no problems with respiration noted  ?Abdomen: Soft, gravid, appropriate for gestational age. Pain/Pressure: Present     ?Pelvic: Vag. Bleeding: None     ?Cervical exam deferred        ?Extremities: Normal range of motion.  Edema: Trace  ?Mental Status: Normal mood and affect. Normal behavior. Normal judgment and thought content.  ? ?Urinalysis:     ? ?Assessment and Plan:  ?Pregnancy: G2P0010 at [redacted]w[redacted]d ? ?1. Supervision of other normal pregnancy, antepartum ?BP and FHR normal ?Had Horizon drawn at last visit, result not yet available ?Work note given per request ?Partner redolent of tobacco, strongly advised to quit prior to birth of baby ? ?2. Echogenic intracardiac focus of fetus on prenatal ultrasound ?Noted on anatomy scan, otherwise normal ? ?Preterm labor symptoms and general obstetric precautions including but not limited to  vaginal bleeding, contractions, leaking of fluid and fetal movement were reviewed in detail with the patient. ?Please refer to After Visit Summary for other counseling recommendations.  ?Return for Dyad patient, ob visit. ? ? ?Venora Maples, MD ? ?

## 2021-10-22 ENCOUNTER — Other Ambulatory Visit: Payer: Self-pay

## 2021-10-22 DIAGNOSIS — Z3480 Encounter for supervision of other normal pregnancy, unspecified trimester: Secondary | ICD-10-CM

## 2021-10-23 ENCOUNTER — Other Ambulatory Visit: Payer: Medicaid Other

## 2021-10-23 ENCOUNTER — Ambulatory Visit (INDEPENDENT_AMBULATORY_CARE_PROVIDER_SITE_OTHER): Payer: Medicaid Other | Admitting: Family Medicine

## 2021-10-23 ENCOUNTER — Encounter: Payer: Self-pay | Admitting: Family Medicine

## 2021-10-23 VITALS — BP 107/69 | HR 90 | Wt 221.9 lb

## 2021-10-23 DIAGNOSIS — Z3480 Encounter for supervision of other normal pregnancy, unspecified trimester: Secondary | ICD-10-CM

## 2021-10-23 DIAGNOSIS — Z23 Encounter for immunization: Secondary | ICD-10-CM

## 2021-10-23 DIAGNOSIS — O283 Abnormal ultrasonic finding on antenatal screening of mother: Secondary | ICD-10-CM

## 2021-10-23 NOTE — Progress Notes (Signed)
   Subjective:  Felicia Lucas is a 26 y.o. G2P0010 at [redacted]w[redacted]d being seen today for ongoing prenatal care.  She is currently monitored for the following issues for this low-risk pregnancy and has Supervision of other normal pregnancy, antepartum and Echogenic intracardiac focus of fetus on prenatal ultrasound on their problem list.  Patient reports no complaints.  Contractions: Not present. Vag. Bleeding: None.  Movement: Present. Denies leaking of fluid.   The following portions of the patient's history were reviewed and updated as appropriate: allergies, current medications, past family history, past medical history, past social history, past surgical history and problem list. Problem list updated.  Objective:   Vitals:   10/23/21 0835  BP: 107/69  Pulse: 90  Weight: 221 lb 14.4 oz (100.7 kg)    Fetal Status: Fetal Heart Rate (bpm): 143   Movement: Present     General:  Alert, oriented and cooperative. Patient is in no acute distress.  Skin: Skin is warm and dry. No rash noted.   Cardiovascular: Normal heart rate noted  Respiratory: Normal respiratory effort, no problems with respiration noted  Abdomen: Soft, gravid, appropriate for gestational age. Pain/Pressure: Present     Pelvic: Vag. Bleeding: None     Cervical exam deferred        Extremities: Normal range of motion.  Edema: Trace  Mental Status: Normal mood and affect. Normal behavior. Normal judgment and thought content.   Urinalysis:      Assessment and Plan:  Pregnancy: G2P0010 at [redacted]w[redacted]d  1. Supervision of other normal pregnancy, antepartum BP and FHR normal Third trimester labs and tdap today - Tdap vaccine greater than or equal to 7yo IM  2. Echogenic intracardiac focus of fetus on prenatal ultrasound Otherwise normal  Preterm labor symptoms and general obstetric precautions including but not limited to vaginal bleeding, contractions, leaking of fluid and fetal movement were reviewed in detail with the  patient. Please refer to After Visit Summary for other counseling recommendations.  Return in 2 weeks (on 11/06/2021) for Dyad patient, ob visit.   Venora Maples, MD

## 2021-10-23 NOTE — Patient Instructions (Signed)

## 2021-10-24 LAB — CBC
Hematocrit: 33.2 % — ABNORMAL LOW (ref 34.0–46.6)
Hemoglobin: 11.1 g/dL (ref 11.1–15.9)
MCH: 29.7 pg (ref 26.6–33.0)
MCHC: 33.4 g/dL (ref 31.5–35.7)
MCV: 89 fL (ref 79–97)
Platelets: 236 10*3/uL (ref 150–450)
RBC: 3.74 x10E6/uL — ABNORMAL LOW (ref 3.77–5.28)
RDW: 12.1 % (ref 11.7–15.4)
WBC: 6.8 10*3/uL (ref 3.4–10.8)

## 2021-10-24 LAB — RPR: RPR Ser Ql: NONREACTIVE

## 2021-10-24 LAB — GLUCOSE TOLERANCE, 2 HOURS W/ 1HR
Glucose, 1 hour: 111 mg/dL (ref 70–179)
Glucose, 2 hour: 98 mg/dL (ref 70–152)
Glucose, Fasting: 82 mg/dL (ref 70–91)

## 2021-10-24 LAB — HIV ANTIBODY (ROUTINE TESTING W REFLEX): HIV Screen 4th Generation wRfx: NONREACTIVE

## 2021-11-06 ENCOUNTER — Encounter: Payer: Medicaid Other | Admitting: Family Medicine

## 2021-11-08 ENCOUNTER — Encounter (HOSPITAL_COMMUNITY): Payer: Self-pay | Admitting: Emergency Medicine

## 2021-11-08 ENCOUNTER — Ambulatory Visit (HOSPITAL_COMMUNITY)
Admission: EM | Admit: 2021-11-08 | Discharge: 2021-11-08 | Disposition: A | Discharge status: Home / Self Care | Payer: Medicaid Other

## 2021-11-08 DIAGNOSIS — K0889 Other specified disorders of teeth and supporting structures: Secondary | ICD-10-CM

## 2021-11-08 DIAGNOSIS — K047 Periapical abscess without sinus: Secondary | ICD-10-CM | POA: Diagnosis not present

## 2021-11-08 MED ORDER — ACETAMINOPHEN 500 MG PO TABS: 1000.0000 mg | ORAL_TABLET | Freq: Four times a day (QID) | ORAL | 0 refills | Status: DC | PRN

## 2021-11-08 MED ORDER — AMOXICILLIN 500 MG PO CAPS
500.0000 mg | ORAL_CAPSULE | Freq: Three times a day (TID) | ORAL | 0 refills | Status: DC
Start: 2021-11-08 — End: 2021-11-22

## 2021-11-08 MED ORDER — ACETAMINOPHEN 325 MG PO TABS
975.0000 mg | ORAL_TABLET | Freq: Once | ORAL | Status: AC
  Administered 2021-11-08: 975 mg via ORAL

## 2021-11-08 MED ORDER — ACETAMINOPHEN 325 MG PO TABS
ORAL_TABLET | ORAL | Status: AC
  Filled 2021-11-08: qty 3

## 2021-11-08 NOTE — Discharge Instructions (Addendum)
Happy belated birthday!!   Take amoxicillin 3 times daily for your dental infection. Take Tylenol every 6 hours as needed for dental pain. Apply ice to the area 20 minutes on 20 minutes off to decrease inflammation. Perform salt water gargles to reduce swelling.  If you develop any new or worsening symptoms or do not improve in the next 2 to 3 days, please return.  If your symptoms are severe, please go to the emergency room.  Follow-up with your primary care provider for further evaluation and management of your symptoms as well as ongoing wellness visits.  I hope you feel better!

## 2021-11-08 NOTE — ED Provider Notes (Signed)
[facility]    CSN: [id number] Arrival date & time: 11/08/21  1545      History   Chief Complaint Chief Complaint  Patient presents with  . Dental Pain    HPI Felicia Lucas is a 26 y.o. female.   Patient presents to urgent care for evaluation of her left upper dental pain.  It is warm to the touch.  11 out of 10 pain started yesterday.  She cannot afford removal at this time.  She is pregnant.  No vaginal bleeding.    Dental Pain   History reviewed. No pertinent past medical history.  Patient Active Problem List   Diagnosis Date Noted  . Echogenic intracardiac focus of fetus on prenatal ultrasound 09/03/2021  . Supervision of other normal pregnancy, antepartum 06/20/2021    History reviewed. No pertinent surgical history.  OB History     Gravida  2   Para      Term      Preterm      AB  1   Living         SAB      IAB      Ectopic  1   Multiple      Live Births               Home Medications    Prior to Admission medications   Medication Sig Start Date End Date Taking? Authorizing Provider  benzocaine (ORAJEL) 10 % mucosal gel Use as directed 1 Application in the mouth or throat as needed for mouth pain.   Yes [provider]  prenatal vitamin w/FE, FA (PRENATAL 1 + 1) 27-1 MG TABS tablet Take 1 tablet by mouth daily at 12 noon. 06/20/21  Yes Venora Maples, MD  aspirin EC 81 MG tablet Take 1 tablet (81 mg total) by mouth daily. Swallow whole. 06/27/21   Venora Maples, MD  ondansetron (ZOFRAN-ODT) 4 MG disintegrating tablet Take 1 tablet (4 mg total) by mouth every 8 (eight) hours as needed for nausea or vomiting. 06/27/21   Venora Maples, MD  promethazine (PHENERGAN) 12.5 MG tablet Take 1 tablet (12.5 mg total) by mouth every 6 (six) hours as needed for nausea or vomiting. 06/27/21   Venora Maples, MD  terconazole (TERAZOL 7) 0.4 % vaginal cream Place 1 applicator vaginally at bedtime. Patient not  taking: Reported on 10/23/2021 08/29/21   Allayne Stack, DO    Family History Family History  Problem Relation Age of Onset  . ADD / ADHD Neg Hx   . Diabetes Neg Hx   . Hypertension Neg Hx     Social History Social History   Tobacco Use  . Smoking status: Former    Types: Cigarettes  Vaping Use  . Vaping Use: Former  . Substances: Nicotine  Substance Use Topics  . Alcohol use: Not Currently  . Drug use: Not Currently    Types: Marijuana     Allergies   Food   Review of Systems Review of Systems   Physical Exam Triage Vital Signs ED Triage Vitals  Enc Vitals Group     BP 11/08/21 1659 121/82     Pulse Rate 11/08/21 1659 (!) 108     Resp 11/08/21 1659 16     Temp 11/08/21 1659 98.3 F (36.8 C)     Temp Source 11/08/21 1659 Oral     SpO2 11/08/21 1659 98 %     Weight --  Height --      Head Circumference --      Peak Flow --      Pain Score 11/08/21 1702 10     Pain Loc --      Pain Edu? --      Excl. in GC? --    No data found.  Updated Vital Signs BP 121/82 (BP Location: Left Arm)   Pulse (!) 108   Temp 98.3 F (36.8 C) (Oral)   Resp 16   LMP 04/11/2021   SpO2 98%   Breastfeeding No   Visual Acuity Right Eye Distance:   Left Eye Distance:   Bilateral Distance:    Right Eye Near:   Left Eye Near:    Bilateral Near:     Physical Exam   UC Treatments / Results  Labs (all labs ordered are listed, but only abnormal results are displayed) Labs Reviewed - No data to display  EKG   Radiology No results found.  Procedures Procedures (including critical care time)  Medications Ordered in UC Medications - No data to display  Initial Impression / Assessment and Plan / UC Course  I have reviewed the triage vital signs and the nursing notes.  Pertinent labs & imaging results that were available during my care of the patient were reviewed by me and considered in my medical decision making (see chart for details).      *** Final Clinical Impressions(s) / UC Diagnoses   Final diagnoses:  None   Discharge Instructions   None    ED Prescriptions   None    PDMP not reviewed this encounter.

## 2021-11-08 NOTE — ED Triage Notes (Addendum)
Patient c/o dental pain x 1 day.   Patient endorses LFT upper dental pain.   Patient endorses " I have been to the dentist, and I don't have the money to get the tooth pulled right now".   Patient has sued Orajel with no relief of symptoms.   Patient is 7 months pregnant.

## 2021-11-22 ENCOUNTER — Ambulatory Visit (INDEPENDENT_AMBULATORY_CARE_PROVIDER_SITE_OTHER): Payer: Medicaid Other | Admitting: Family Medicine

## 2021-11-22 ENCOUNTER — Encounter: Payer: Self-pay | Admitting: Family Medicine

## 2021-11-22 ENCOUNTER — Other Ambulatory Visit: Payer: Self-pay

## 2021-11-22 VITALS — BP 104/71 | HR 92 | Wt 224.0 lb

## 2021-11-22 DIAGNOSIS — K047 Periapical abscess without sinus: Secondary | ICD-10-CM

## 2021-11-22 DIAGNOSIS — Z3480 Encounter for supervision of other normal pregnancy, unspecified trimester: Secondary | ICD-10-CM

## 2021-11-22 NOTE — Patient Instructions (Addendum)
   We recommend childbirth education to help your cope and plan for labor.   [employer] [facility] has free or nearly free classes that you are able to sign up online. They have a combination of virtual and in person classes. Additionally they offer breastfeeding specific classes, infant CPR classes and classes for partners and grandparents. The classes are held on the [place] and [place] campuses.   Childbirth Education Options:  [facility] Department Classes:  Childbirth education classes can help you get ready for a positive parenting experience. You can also meet other expectant parents and get free stuff for your baby. Each class runs for five weeks on the same night and costs $45 for the mother-to-be and her support person. Medicaid covers the cost if you are eligible. Call [phone] to register.  [facility] Childbirth Education: Classes can vary in availability and schedule is subject to change. For most up-to-date information please visit [email or web address] to review and register.  [email or web address]   There are also many childbirth education books. The book entitled "The Birth Partner"  by Michael Litter is a text that is used to train doulas (trained birth support persons) and is a good Theatre stage manager for all families.   For Biomedical scientist-- Call  your insurance.   Dental Resources   [place]   Dr. Remer Macho  Exam $85   [address]  Extraction $120 and up   [employer], [place]  *full list of prices [phone]      [facility]  Exam [phone]   [address]  Exam w/ Xrays $380   [place], [place]  Xrays $56 and up   [phone]  Cleaning $101   Extraction $190 and up      [facility]  Cleaning + Xray $344   [address]  Extraction- pt has to be seen first to give price   [place], [place]   [phone]     [facility]   Dr. Romeo Apple  [place]/Dr. Richrd Humbles  Exam, Cleaning, Xray $262   [address]  Extraction [phone]   [place]   [phone]      [facility] Dental Department  Cleaning $5   [address] $5   [place], [place] [address]  Call to get on waiting list   [phone] ext [phone]     Dr. Fayrene Fearing [place]/Dr. Stephenie Acres   [address]  Xray $85 Each   [place], [place] [address]  Extraction [phone]    Dr. Hoover Browns  Extraction $300 per tooth   [address]   [place], [place] [address]   [phone]      Dr. Nuala Alpha  Cleaning $300   [address]  Extraction $273   [place], [place] [address]   [phone]     [facility]  Emergency Exam $65   [address]  Cleaning & Exam $150   [place], [place] [address]  Extractions: Simple $180 Surgical $250   [phone]  [place] [phone]

## 2021-11-22 NOTE — Progress Notes (Signed)
   PRENATAL VISIT NOTE  Subjective:  Felicia Lucas is a 26 y.o. G2P0010 at [redacted]w[redacted]d being seen today for ongoing prenatal care.  She is currently monitored for the following issues for this low-risk pregnancy and has Supervision of other normal pregnancy, antepartum and Echogenic intracardiac focus of fetus on prenatal ultrasound on their problem list.  Patient reports no complaints.  Contractions: Not present. Vag. Bleeding: None.  Movement: Present. Denies leaking of fluid.   The following portions of the patient's history were reviewed and updated as appropriate: allergies, current medications, past family history, past medical history, past social history, past surgical history and problem list.   Objective:   Vitals:   11/22/21 1109  BP: 104/71  Pulse: 92  Weight: 224 lb (101.6 kg)    Fetal Status: Fetal Heart Rate (bpm): 141   Movement: Present     General:  Alert, oriented and cooperative. Patient is in no acute distress.  Skin: Skin is warm and dry. No rash noted.   Cardiovascular: Normal heart rate noted  Respiratory: Normal respiratory effort, no problems with respiration noted  Abdomen: Soft, gravid, appropriate for gestational age.  Pain/Pressure: Present     Pelvic: Cervical exam deferred        Extremities: Normal range of motion.  Edema: Trace  Mental Status: Normal mood and affect. Normal behavior. Normal judgment and thought content.   Assessment and Plan:  Pregnancy: G2P0010 at [redacted]w[redacted]d 1. Supervision of other normal pregnancy, antepartum Desires pills Plans on epidural Thinking more about formula Wants to take CB ed classes, given cone website Asked about carseat, referred patient to insurance card to call about coverage Reviewed [facility] and peds visits  2. Dental abscess Treated at urgent care with amox, currently has sensitivity but less pain Needs dental care Given letter and list of resources Referred to [facility] dental clinic    Preterm labor  symptoms and general obstetric precautions including but not limited to vaginal bleeding, contractions, leaking of fluid and fetal movement were reviewed in detail with the patient. Please refer to After Visit Summary for other counseling recommendations.   Return in about 2 weeks (around 12/06/2021) for Routine prenatal care, Mom+Baby Combined Care.  Future Appointments  Date Time Provider Department Center  12/06/2021  9:15 AM Reva Bores, MD [facility]  12/20/2021  1:35 PM Federico Flake, MD [facility]  12/28/2021  9:55 AM Federico Flake, MD [facility]  01/03/2022  9:15 AM Reva Bores, MD [facility]  01/11/2022 11:15 AM Venora Maples, MD [facility]  01/16/2022 10:15 AM [facility]  01/16/2022 11:15 AM Venora Maples, MD [facility]    Federico Flake, MD

## 2021-12-06 ENCOUNTER — Other Ambulatory Visit: Payer: Self-pay

## 2021-12-06 ENCOUNTER — Encounter: Payer: Self-pay | Admitting: Family Medicine

## 2021-12-06 ENCOUNTER — Ambulatory Visit (INDEPENDENT_AMBULATORY_CARE_PROVIDER_SITE_OTHER): Payer: Medicaid Other | Admitting: Family Medicine

## 2021-12-06 VITALS — BP 100/67 | HR 114 | Wt 223.9 lb

## 2021-12-06 DIAGNOSIS — Z3480 Encounter for supervision of other normal pregnancy, unspecified trimester: Secondary | ICD-10-CM

## 2021-12-06 DIAGNOSIS — G5602 Carpal tunnel syndrome, left upper limb: Secondary | ICD-10-CM

## 2021-12-06 NOTE — Progress Notes (Signed)
   PRENATAL VISIT NOTE  Subjective:  Felicia Lucas is a 26 y.o. G2P0010 at 104w1d being seen today for ongoing prenatal care.  She is currently monitored for the following issues for this low-risk pregnancy and has Supervision of other normal pregnancy, antepartum and Echogenic intracardiac focus of fetus on prenatal ultrasound on their problem list.  Patient reports carpal tunnel symptoms.  Contractions: Not present. Vag. Bleeding: None.  Movement: Present. Denies leaking of fluid.   The following portions of the patient's history were reviewed and updated as appropriate: allergies, current medications, past family history, past medical history, past social history, past surgical history and problem list.   Objective:   Vitals:   12/06/21 0931  BP: 100/67  Pulse: (!) 114  Weight: 223 lb 14.4 oz (101.6 kg)    Fetal Status: Fetal Heart Rate (bpm): 137 Fundal Height: 32 cm Movement: Present     General:  Alert, oriented and cooperative. Patient is in no acute distress.  Skin: Skin is warm and dry. No rash noted.   Cardiovascular: Normal heart rate noted  Respiratory: Normal respiratory effort, no problems with respiration noted  Abdomen: Soft, gravid, appropriate for gestational age.  Pain/Pressure: Absent     Pelvic: Cervical exam deferred        Extremities: Normal range of motion.  Edema: Trace  Mental Status: Normal mood and affect. Normal behavior. Normal judgment and thought content.   Assessment and Plan:  Pregnancy: G2P0010 at [redacted]w[redacted]d 1. Supervision of other normal pregnancy, antepartum Continue routine prenatal care.  2. Carpal tunnel syndrome of left wrist Cock-up wrist splints especially at night--if not working consider steroid injection.  Preterm labor symptoms and general obstetric precautions including but not limited to vaginal bleeding, contractions, leaking of fluid and fetal movement were reviewed in detail with the patient. Please refer to After Visit  Summary for other counseling recommendations.   Return in 2 weeks (on 12/20/2021) for Mom+Baby Care.  Future Appointments  Date Time Provider Department Center  12/20/2021  1:35 PM Federico Flake, MD [facility]  12/28/2021  9:55 AM Federico Flake, MD [facility]  01/03/2022  9:15 AM Reva Bores, MD [facility]  01/11/2022 11:15 AM Venora Maples, MD [facility]  01/16/2022 10:15 AM [facility] NST [facility]  01/16/2022 11:15 AM Venora Maples, MD [facility]    Reva Bores, MD

## 2021-12-20 ENCOUNTER — Other Ambulatory Visit (HOSPITAL_COMMUNITY)
Admission: RE | Admit: 2021-12-20 | Discharge: 2021-12-20 | Disposition: A | Discharge status: Home / Self Care | Payer: Medicaid Other | Source: Ambulatory Visit | Attending: Family Medicine | Admitting: Family Medicine

## 2021-12-20 ENCOUNTER — Ambulatory Visit (INDEPENDENT_AMBULATORY_CARE_PROVIDER_SITE_OTHER): Payer: Medicaid Other | Admitting: Family Medicine

## 2021-12-20 ENCOUNTER — Other Ambulatory Visit: Payer: Self-pay

## 2021-12-20 ENCOUNTER — Encounter: Payer: Self-pay | Admitting: Family Medicine

## 2021-12-20 VITALS — BP 103/72 | HR 101 | Wt 222.6 lb

## 2021-12-20 DIAGNOSIS — Z3480 Encounter for supervision of other normal pregnancy, unspecified trimester: Secondary | ICD-10-CM

## 2021-12-20 DIAGNOSIS — B9689 Other specified bacterial agents as the cause of diseases classified elsewhere: Secondary | ICD-10-CM

## 2021-12-20 DIAGNOSIS — G5600 Carpal tunnel syndrome, unspecified upper limb: Secondary | ICD-10-CM | POA: Insufficient documentation

## 2021-12-20 DIAGNOSIS — N760 Acute vaginitis: Secondary | ICD-10-CM

## 2021-12-20 DIAGNOSIS — B3731 Acute candidiasis of vulva and vagina: Secondary | ICD-10-CM

## 2021-12-20 DIAGNOSIS — G5603 Carpal tunnel syndrome, bilateral upper limbs: Secondary | ICD-10-CM

## 2021-12-20 NOTE — Progress Notes (Signed)
   PRENATAL VISIT NOTE  Subjective:  Felicia Lucas is a 26 y.o. G2P0010 at [redacted]w[redacted]d being seen today for ongoing prenatal care.  She is currently monitored for the following issues for this low-risk pregnancy and has Supervision of other normal pregnancy, antepartum; Echogenic intracardiac focus of fetus on prenatal ultrasound; and Carpal tunnel syndrome on their problem list.  Patient reports  left wrist is painful with numbness in digits 1-4 regularly. Wears braces regularly and esp at night. Has noted right symptoms as well.  .  Contractions: Not present. Vag. Bleeding: None.  Movement: Present. Denies leaking of fluid.   The following portions of the patient's history were reviewed and updated as appropriate: allergies, current medications, past family history, past medical history, past social history, past surgical history and problem list.   Objective:   Vitals:   12/20/21 1347  BP: 103/72  Pulse: (!) 101  Weight: 222 lb 9.6 oz (101 kg)    Fetal Status: Fetal Heart Rate (bpm): 140   Movement: Present     General:  Alert, oriented and cooperative. Patient is in no acute distress.  Skin: Skin is warm and dry. No rash noted.   Cardiovascular: Normal heart rate noted  Respiratory: Normal respiratory effort, no problems with respiration noted  Abdomen: Soft, gravid, appropriate for gestational age.  Pain/Pressure: Absent     Pelvic: Chaperone present today for vaginal swab collection        Extremities: Normal range of motion.  Edema: Trace  Mental Status: Normal mood and affect. Normal behavior. Normal judgment and thought content.   Assessment and Plan:  Pregnancy: G2P0010 at [redacted]w[redacted]d 1. Supervision of other normal pregnancy, antepartum Reviewed labor - Culture, beta strep (group b only) - Cervicovaginal ancillary only( [place])  2. Bilateral carpal tunnel syndrome Injection of left side today per patient preference (see separate note for details) Will possibly want  other side at next visit   Preterm labor symptoms and general obstetric precautions including but not limited to vaginal bleeding, contractions, leaking of fluid and fetal movement were reviewed in detail with the patient. Please refer to After Visit Summary for other counseling recommendations.   Return in about 1 week (around 12/27/2021) for Routine prenatal care.  Future Appointments  Date Time Provider Department Center  12/28/2021  9:55 AM Federico Flake, MD [facility]  01/03/2022  9:15 AM Reva Bores, MD [facility]  01/11/2022 11:15 AM Venora Maples, MD [facility]  01/16/2022 10:15 AM [facility] N[facility]  01/16/2022 11:15 AM Venora Maples, MD [facility]    Federico Flake, MD

## 2021-12-20 NOTE — Progress Notes (Signed)
Carpal Tunnel Injection Procedure note:  Patient diagnosed with carpal tunnel syndrome during pregnancy. Has tried splints without relief of symptoms. Current symptoms are left wrist/hands.  Discussed role of steroid injection to help alleviate symptoms as conservative measures have not provided relief. Reviewed risks/benefits of procedure. Reviewed complications of procedure which include but are not limited to bleeding, elevated blood glucose levels, infection, median nerve injury, pain, and paresthesias  Patient instructed to inform physician of any tingling or loss of sensation in hand.   After obtaining informed verbal and written consent for carpal tunnel injection, proper site was identified on the left wrist one cm medial to the palmaris longus tendon and one cm proximal to the distal palmar crease. The area was cleaned with alcohol and then anesthetized with hurricane spray. A mixture of 2 cc 10 mg/mL Kenalog (total of 20 mg) and 1 cc of 1% lidocaine was injected by directing the needle (23 gauge) towards the base of the thumb at a 45 degree angle at a depth of approximately one centimeter. Mixture flowed easily and patient reported no paresthesias during the procedure. A bandaid was then applied and patient instructed to mobilize wrist for remainder of day to help solution spread. Patient tolerated the procedure well.  CPT Code: 18485, left modifier

## 2021-12-21 LAB — CERVICOVAGINAL ANCILLARY ONLY
Bacterial Vaginitis (gardnerella): POSITIVE — AB
Candida Glabrata: NEGATIVE
Candida Vaginitis: POSITIVE — AB
Chlamydia: NEGATIVE
Comment: NEGATIVE
Comment: NEGATIVE
Comment: NEGATIVE
Comment: NEGATIVE
Comment: NORMAL
Neisseria Gonorrhea: NEGATIVE

## 2021-12-21 MED ORDER — FLUCONAZOLE 150 MG PO TABS: 150.0000 mg | ORAL_TABLET | Freq: Once | ORAL | 1 refills | Status: AC

## 2021-12-21 MED ORDER — METRONIDAZOLE 500 MG PO TABS: 500.0000 mg | ORAL_TABLET | Freq: Two times a day (BID) | ORAL | 0 refills | Status: DC

## 2021-12-21 NOTE — Addendum Note (Signed)
Addended by: Geanie Berlin on: 12/21/2021 03:43 PM   Modules accepted: Orders

## 2021-12-25 LAB — CULTURE, BETA STREP (GROUP B ONLY): Strep Gp B Culture: NEGATIVE

## 2021-12-27 NOTE — Progress Notes (Signed)
    PRENATAL VISIT NOTE  Subjective:  Felicia Lucas is a 26 y.o. G2P0010 at [redacted]w[redacted]d being seen today for ongoing prenatal care.  She is currently monitored for the following issues for this low-risk pregnancy and has Supervision of other normal pregnancy, antepartum; Echogenic intracardiac focus of fetus on prenatal ultrasound; and Carpal tunnel syndrome on their problem list.  Patient reports no complaints.  Contractions: Irritability. Vag. Bleeding: None.  Movement: Present. Denies leaking of fluid.   The following portions of the patient's history were reviewed and updated as appropriate: allergies, current medications, past family history, past medical history, past social history, past surgical history and problem list.   Objective:   Vitals:   12/28/21 1014  BP: 102/71  Pulse: 98  Weight: 228 lb 6.4 oz (103.6 kg)    Fetal Status: Fetal Heart Rate (bpm): 132 Fundal Height: 37 cm Movement: Present  Presentation: Vertex  General:  Alert, oriented and cooperative. Patient is in no acute distress.  Skin: Skin is warm and dry. No rash noted.   Cardiovascular: Normal heart rate noted  Respiratory: Normal respiratory effort, no problems with respiration noted  Abdomen: Soft, gravid, appropriate for gestational age.  Pain/Pressure: Present     Pelvic: Cervical exam performed in the presence of a chaperone Dilation: 1 Effacement (%): Thick Station: -3  Extremities: Normal range of motion.  Edema: Trace  Mental Status: Normal mood and affect. Normal behavior. Normal judgment and thought content.   Assessment and Plan:  Pregnancy: G2P0010 at [redacted]w[redacted]d 1. Bilateral carpal tunnel syndrome Still present, injection did not help last week  2. Supervision of other normal pregnancy, antepartum UTD Doing well  3. Echogenic intracardiac focus of fetus on prenatal ultrasound  Term labor symptoms and general obstetric precautions including but not limited to vaginal bleeding, contractions,  leaking of fluid and fetal movement were reviewed in detail with the patient. Please refer to After Visit Summary for other counseling recommendations.   Return in about 1 week (around 01/04/2022) for Routine prenatal care.  Future Appointments  Date Time Provider Department Center  01/03/2022  9:15 AM Reva Bores, MD [facility]  01/11/2022 11:15 AM Venora Maples, MD [facility]  01/16/2022 10:15 AM [facility] N[facility]  01/16/2022 11:15 AM Venora Maples, MD [facility]    Federico Flake, MD

## 2021-12-28 ENCOUNTER — Ambulatory Visit (INDEPENDENT_AMBULATORY_CARE_PROVIDER_SITE_OTHER): Payer: Medicaid Other | Admitting: Family Medicine

## 2021-12-28 ENCOUNTER — Other Ambulatory Visit: Payer: Self-pay

## 2021-12-28 ENCOUNTER — Encounter: Payer: Self-pay | Admitting: Family Medicine

## 2021-12-28 VITALS — BP 102/71 | HR 98 | Wt 228.4 lb

## 2021-12-28 DIAGNOSIS — G5603 Carpal tunnel syndrome, bilateral upper limbs: Secondary | ICD-10-CM

## 2021-12-28 DIAGNOSIS — O283 Abnormal ultrasonic finding on antenatal screening of mother: Secondary | ICD-10-CM

## 2021-12-28 DIAGNOSIS — Z3480 Encounter for supervision of other normal pregnancy, unspecified trimester: Secondary | ICD-10-CM

## 2022-01-03 ENCOUNTER — Ambulatory Visit (INDEPENDENT_AMBULATORY_CARE_PROVIDER_SITE_OTHER): Payer: Medicaid Other | Admitting: Family Medicine

## 2022-01-03 ENCOUNTER — Other Ambulatory Visit: Payer: Self-pay

## 2022-01-03 VITALS — BP 109/69 | HR 101 | Wt 227.7 lb

## 2022-01-03 DIAGNOSIS — G5603 Carpal tunnel syndrome, bilateral upper limbs: Secondary | ICD-10-CM

## 2022-01-03 DIAGNOSIS — Z3480 Encounter for supervision of other normal pregnancy, unspecified trimester: Secondary | ICD-10-CM

## 2022-01-03 NOTE — Progress Notes (Signed)
   PRENATAL VISIT NOTE  Subjective:  Felicia Lucas is a 26 y.o. G2P0010 at [redacted]w[redacted]d being seen today for ongoing prenatal care.  She is currently monitored for the following issues for this low-risk pregnancy and has Supervision of other normal pregnancy, antepartum; Echogenic intracardiac focus of fetus on prenatal ultrasound; and Carpal tunnel syndrome on their problem list.  Patient reports no complaints.  Contractions: Irritability. Vag. Bleeding: None.  Movement: Present. Denies leaking of fluid.   The following portions of the patient's history were reviewed and updated as appropriate: allergies, current medications, past family history, past medical history, past social history, past surgical history and problem list.   Objective:   Vitals:   01/03/22 0929  BP: 109/69  Pulse: (!) 101  Weight: 227 lb 11.2 oz (103.3 kg)    Fetal Status: Fetal Heart Rate (bpm): 127 Fundal Height: 38 cm Movement: Present  Presentation: Vertex  General:  Alert, oriented and cooperative. Patient is in no acute distress.  Skin: Skin is warm and dry. No rash noted.   Cardiovascular: Normal heart rate noted  Respiratory: Normal respiratory effort, no problems with respiration noted  Abdomen: Soft, gravid, appropriate for gestational age.  Pain/Pressure: Present     Pelvic: Cervical exam deferred        Extremities: Normal range of motion.  Edema: Trace  Mental Status: Normal mood and affect. Normal behavior. Normal judgment and thought content.   Assessment and Plan:  Pregnancy: G2P0010 at [redacted]w[redacted]d 1. Supervision of other normal pregnancy, antepartum Continue routine prenatal care.  Term labor symptoms and general obstetric precautions including but not limited to vaginal bleeding, contractions, leaking of fluid and fetal movement were reviewed in detail with the patient. Please refer to After Visit Summary for other counseling recommendations.   Return in 1 week (on 01/10/2022).  Future  Appointments  Date Time Provider Department Center  01/11/2022 11:15 AM Venora Maples, MD [facility]  01/16/2022 10:15 AM [facility]  01/16/2022 11:15 AM Venora Maples, MD [facility]    Reva Bores, MD

## 2022-01-03 NOTE — Patient Instructions (Signed)
[place]

## 2022-01-11 ENCOUNTER — Encounter: Payer: Self-pay | Admitting: Family Medicine

## 2022-01-11 ENCOUNTER — Ambulatory Visit (INDEPENDENT_AMBULATORY_CARE_PROVIDER_SITE_OTHER): Payer: Medicaid Other | Admitting: Family Medicine

## 2022-01-11 ENCOUNTER — Other Ambulatory Visit: Payer: Self-pay

## 2022-01-11 VITALS — BP 108/75 | HR 94 | Wt 226.0 lb

## 2022-01-11 DIAGNOSIS — Z3480 Encounter for supervision of other normal pregnancy, unspecified trimester: Secondary | ICD-10-CM

## 2022-01-11 DIAGNOSIS — G5603 Carpal tunnel syndrome, bilateral upper limbs: Secondary | ICD-10-CM

## 2022-01-11 DIAGNOSIS — O283 Abnormal ultrasonic finding on antenatal screening of mother: Secondary | ICD-10-CM

## 2022-01-11 NOTE — Progress Notes (Signed)
   Subjective:  Felicia Lucas is a 26 y.o. G2P0010 at [redacted]w[redacted]d being seen today for ongoing prenatal care.  She is currently monitored for the following issues for this low-risk pregnancy and has Supervision of other normal pregnancy, antepartum; Echogenic intracardiac focus of fetus on prenatal ultrasound; and Carpal tunnel syndrome on their problem list.  Patient reports no complaints.  Contractions: Irregular. Vag. Bleeding: None.  Movement: Present. Denies leaking of fluid.   The following portions of the patient's history were reviewed and updated as appropriate: allergies, current medications, past family history, past medical history, past social history, past surgical history and problem list. Problem list updated.  Objective:   Vitals:   01/11/22 1135  BP: 108/75  Pulse: 94  Weight: 226 lb (102.5 kg)    Fetal Status: Fetal Heart Rate (bpm): 135   Movement: Present     General:  Alert, oriented and cooperative. Patient is in no acute distress.  Skin: Skin is warm and dry. No rash noted.   Cardiovascular: Normal heart rate noted  Respiratory: Normal respiratory effort, no problems with respiration noted  Abdomen: Soft, gravid, appropriate for gestational age. Pain/Pressure: Present     Pelvic: Vag. Bleeding: None     Cervical exam performed        Extremities: Normal range of motion.     Mental Status: Normal mood and affect. Normal behavior. Normal judgment and thought content.   Urinalysis:      Assessment and Plan:  Pregnancy: G2P0010 at [redacted]w[redacted]d  1. Supervision of other normal pregnancy, antepartum BP and FHR normal Discussed post dates testing for next week and scheduled for PD IOL Cervix 1/Thick/-1, vertex Form faxed, orders placed  2. Echogenic intracardiac focus of fetus on prenatal ultrasound Otherwise normal  Preterm labor symptoms and general obstetric precautions including but not limited to vaginal bleeding, contractions, leaking of fluid and fetal  movement were reviewed in detail with the patient. Please refer to After Visit Summary for other counseling recommendations.  Return in 1 week (on 01/18/2022) for Dyad patient, ob visit, post dates BPP.   Venora Maples, MD

## 2022-01-11 NOTE — Patient Instructions (Signed)

## 2022-01-14 ENCOUNTER — Inpatient Hospital Stay (EMERGENCY_DEPARTMENT_HOSPITAL)
Admission: AD | Admit: 2022-01-14 | Discharge: 2022-01-15 | Disposition: A | Discharge status: Home / Self Care | Payer: Medicaid Other | Source: Home / Self Care | Attending: Obstetrics and Gynecology | Admitting: Obstetrics and Gynecology

## 2022-01-14 ENCOUNTER — Encounter (HOSPITAL_COMMUNITY): Payer: Self-pay | Admitting: Obstetrics and Gynecology

## 2022-01-14 DIAGNOSIS — O479 False labor, unspecified: Secondary | ICD-10-CM | POA: Diagnosis not present

## 2022-01-14 DIAGNOSIS — O480 Post-term pregnancy: Secondary | ICD-10-CM | POA: Insufficient documentation

## 2022-01-14 DIAGNOSIS — Z3A40 40 weeks gestation of pregnancy: Secondary | ICD-10-CM | POA: Insufficient documentation

## 2022-01-14 DIAGNOSIS — O471 False labor at or after 37 completed weeks of gestation: Secondary | ICD-10-CM | POA: Insufficient documentation

## 2022-01-14 NOTE — MAU Note (Cosign Needed)
Pt came in for a labor check:  Per RN:  Pt says she feels UC all day - strong since 5pm- stopped then started at 7pm [facility]- clinic  VE on last Thursday - 1 cm Denies HSV GBS- neg  NST: FHR baseline 130 bpm, Variability: moderate, Accelerations:present, Decelerations:  Absent= Cat 1/Reactive  Irregular and mild ctx.  No change in cx after >1 hour obs.  DC home w/labor precautions.

## 2022-01-14 NOTE — MAU Note (Signed)
Pt says she feels UC all day - strong since 5pm- stopped then started at 7pm [facility]- clinic  VE on last Thursday - 1 cm Denies HSV GBS- neg

## 2022-01-15 ENCOUNTER — Encounter (HOSPITAL_COMMUNITY): Payer: Self-pay | Admitting: Obstetrics and Gynecology

## 2022-01-15 ENCOUNTER — Other Ambulatory Visit: Payer: Self-pay

## 2022-01-15 ENCOUNTER — Encounter (HOSPITAL_COMMUNITY): Payer: Self-pay | Admitting: *Deleted

## 2022-01-15 ENCOUNTER — Telehealth (HOSPITAL_COMMUNITY): Payer: Self-pay | Admitting: *Deleted

## 2022-01-15 ENCOUNTER — Inpatient Hospital Stay (HOSPITAL_COMMUNITY)
Admission: AD | Admit: 2022-01-15 | Discharge: 2022-01-19 | DRG: 805 | Disposition: A | Discharge status: Home / Self Care | Payer: Medicaid Other | Attending: Obstetrics and Gynecology | Admitting: Obstetrics and Gynecology

## 2022-01-15 DIAGNOSIS — O411230 Chorioamnionitis, third trimester, not applicable or unspecified: Secondary | ICD-10-CM | POA: Diagnosis present

## 2022-01-15 DIAGNOSIS — Z20822 Contact with and (suspected) exposure to covid-19: Secondary | ICD-10-CM | POA: Diagnosis present

## 2022-01-15 DIAGNOSIS — Z3A39 39 weeks gestation of pregnancy: Secondary | ICD-10-CM | POA: Diagnosis not present

## 2022-01-15 DIAGNOSIS — O4290 Premature rupture of membranes, unspecified as to length of time between rupture and onset of labor, unspecified weeks of gestation: Principal | ICD-10-CM

## 2022-01-15 DIAGNOSIS — Z3A40 40 weeks gestation of pregnancy: Secondary | ICD-10-CM | POA: Diagnosis not present

## 2022-01-15 DIAGNOSIS — Z87891 Personal history of nicotine dependence: Secondary | ICD-10-CM | POA: Diagnosis not present

## 2022-01-15 DIAGNOSIS — O368130 Decreased fetal movements, third trimester, not applicable or unspecified: Secondary | ICD-10-CM | POA: Diagnosis not present

## 2022-01-15 DIAGNOSIS — O4202 Full-term premature rupture of membranes, onset of labor within 24 hours of rupture: Secondary | ICD-10-CM | POA: Diagnosis not present

## 2022-01-15 DIAGNOSIS — O4292 Full-term premature rupture of membranes, unspecified as to length of time between rupture and onset of labor: Principal | ICD-10-CM | POA: Diagnosis present

## 2022-01-15 DIAGNOSIS — Z3480 Encounter for supervision of other normal pregnancy, unspecified trimester: Secondary | ICD-10-CM

## 2022-01-15 DIAGNOSIS — O480 Post-term pregnancy: Secondary | ICD-10-CM | POA: Diagnosis present

## 2022-01-15 LAB — POCT FERN TEST
POCT Fern Test: POSITIVE
POCT Fern Test: POSITIVE

## 2022-01-15 MED ORDER — ACETAMINOPHEN 325 MG PO TABS: 650.0000 mg | ORAL_TABLET | ORAL | Status: DC | PRN

## 2022-01-15 MED ORDER — MISOPROSTOL 25 MCG QUARTER TABLET
25.0000 ug | ORAL_TABLET | Freq: Once | ORAL | Status: AC
  Administered 2022-01-16: 25 ug via VAGINAL
  Filled 2022-01-15: qty 1

## 2022-01-15 MED ORDER — SOD CITRATE-CITRIC ACID 500-334 MG/5ML PO SOLN: 30.0000 mL | ORAL | Status: DC | PRN

## 2022-01-15 MED ORDER — ONDANSETRON HCL 4 MG/2ML IJ SOLN
4.0000 mg | Freq: Four times a day (QID) | INTRAMUSCULAR | Status: DC | PRN
  Administered 2022-01-16: 4 mg via INTRAVENOUS
  Filled 2022-01-15: qty 2

## 2022-01-15 MED ORDER — FENTANYL CITRATE (PF) 100 MCG/2ML IJ SOLN: 100.0000 ug | INTRAMUSCULAR | Status: DC | PRN

## 2022-01-15 MED ORDER — FENTANYL CITRATE (PF) 100 MCG/2ML IJ SOLN: 50.0000 ug | INTRAMUSCULAR | Status: DC | PRN

## 2022-01-15 MED ORDER — LACTATED RINGERS IV SOLN
500.0000 mL | INTRAVENOUS | Status: DC | PRN
  Administered 2022-01-16: 1000 mL via INTRAVENOUS
  Administered 2022-01-16: 500 mL via INTRAVENOUS

## 2022-01-15 MED ORDER — OXYTOCIN-SODIUM CHLORIDE 30-0.9 UT/500ML-% IV SOLN: 2.5000 [IU]/h | INTRAVENOUS | Status: DC

## 2022-01-15 MED ORDER — TERBUTALINE SULFATE 1 MG/ML IJ SOLN: 0.2500 mg | Freq: Once | INTRAMUSCULAR | Status: DC | PRN

## 2022-01-15 MED ORDER — LIDOCAINE HCL (PF) 1 % IJ SOLN
30.0000 mL | INTRAMUSCULAR | Status: DC | PRN
Start: 2022-01-15 — End: 2022-01-17

## 2022-01-15 MED ORDER — MISOPROSTOL 50MCG HALF TABLET
50.0000 ug | ORAL_TABLET | Freq: Once | ORAL | Status: AC
  Administered 2022-01-16: 50 ug via ORAL
  Filled 2022-01-15: qty 1

## 2022-01-15 MED ORDER — OXYTOCIN BOLUS FROM INFUSION
333.0000 mL | Freq: Once | INTRAVENOUS | Status: AC
  Administered 2022-01-17: 333 mL via INTRAVENOUS

## 2022-01-15 MED ORDER — LACTATED RINGERS IV SOLN: INTRAVENOUS | Status: DC

## 2022-01-15 NOTE — H&P (Incomplete)
OBSTETRIC ADMISSION HISTORY AND PHYSICAL  Felicia Lucas is a 26 y.o. female G2P0010 with IUP at [redacted]w[redacted]d by LMP presenting for PROM. DFM over the past hours. No VB, no blurry vision, headaches or peripheral edema, and RUQ pain.  She plans on breast and bottle feeding. She request POP for birth control. She received her prenatal care at [facility]  Dating: By LMP --->  Estimated Date of Delivery: 01/16/22  Sono:    @[redacted]w[redacted]d , CWD, normal anatomy, breech presentation, posterior lie, 289g, 51% EFW   Prenatal History/Complications:  EIF with LR genetic testing   Nursing Staff Provider  Office Location  [facility] Dating  LMP  [facility] Model [ ]  Traditional [ ]  Centering [facility] ] Mom-Baby Dyad    Language  English Anatomy  EIF, otherwise normal  Flu Vaccine  declined Genetic/Carrier Screen  NIPS:   Low risk AFP:    normal Horizon: neg 4/4   TDaP Vaccine   10/23/2021 Hgb A1C or  GTT Early - normal Third trimester - normal  COVID Vaccine No   LAB RESULTS   Rhogam  n/a Blood Type B/Positive/-- (02/01 1630)   Baby Feeding Plan Breast/Formula Antibody Negative (02/01 1630)  Contraception pill Rubella 6.71 (02/01 1630)  Circumcision GIRL RPR Non Reactive (02/01 1630)   Pediatrician  Mom Baby Dyad HBsAg Negative (02/01 1630)   Support Person Tremaine(FOB) HCVAb neg  Prenatal Classes  HIV Non Reactive (02/01 1630)     BTL Consent NA GBS   (For PCN allergy, check sensitivities)   VBAC Consent NA Pap 06/27/21 NILM       DME Rx [birth year]1980 ] BP cuff [ ]  Weight Scale Waterbirth  [ ]  Class [ ]  Consent [ ]  CNM visit  PHQ9 & GAD7 [x  ] new OB [  ] 28 weeks  [  ] 36 weeks Induction  [ ]  Orders Entered [ ] Foley Y/N    Past Medical History: History reviewed. No pertinent past medical history.  Past Surgical History: History reviewed. No pertinent surgical history.  Obstetrical History: OB History     Gravida  2   Para      Term      Preterm      AB  1   Living         SAB      IAB       Ectopic  1   Multiple      Live Births              Social History Social History   Socioeconomic History   Marital status: Single    Spouse name: Not on file   Number of children: Not on file   Years of education: Not on file   Highest education level: Not on file  Occupational History   Not on file  Tobacco Use   Smoking status: Former    Types: Cigarettes   Smokeless tobacco: Not on file  Vaping Use   Vaping Use: Former   Substances: Nicotine  Substance and Sexual Activity   Alcohol use: Not Currently   Drug use: Not Currently    Types: Marijuana   Sexual activity: Yes    Birth control/protection: None  Other Topics Concern   Not on file  Social History Narrative   Not on file   Social Determinants of Health   Financial Resource Strain: Not on file  Food Insecurity: Food Insecurity Present (01/11/2022)   Hunger Vital Sign  Worried About Programme researcher, broadcasting/film/video in the Last Year: Sometimes true    [employer] of Food in the Last Year: Sometimes true  Transportation Needs: No Transportation Needs (01/11/2022)   PRAPARE - Administrator, Civil Service (Medical): No    Lack of Transportation (Non-Medical): No  Physical Activity: Not on file  Stress: Not on file  Social Connections: Not on file    Family History: Family History  Problem Relation Age of Onset   ADD / ADHD Neg Hx    Diabetes Neg Hx    Hypertension Neg Hx     Allergies: Allergies  Allergen Reactions   Food Rash    Eggplant    Medications Prior to Admission  Medication Sig Dispense Refill Last Dose   acetaminophen (TYLENOL) 500 MG tablet Take 2 tablets (1,000 mg total) by mouth every 6 (six) hours as needed. (Patient taking differently: Take 500 mg by mouth every 6 (six) hours as needed.) 30 tablet 0    aspirin EC 81 MG tablet Take 1 tablet (81 mg total) by mouth daily. Swallow whole. (Patient not taking: Reported on 01/14/2022) 30 tablet 11    benzocaine (ORAJEL) 10 % mucosal gel  Use as directed 1 Application in the mouth or throat as needed for mouth pain.      ondansetron (ZOFRAN-ODT) 4 MG disintegrating tablet Take 1 tablet (4 mg total) by mouth every 8 (eight) hours as needed for nausea or vomiting. 30 tablet 5    prenatal vitamin w/FE, FA (PRENATAL 1 + 1) 27-1 MG TABS tablet Take 1 tablet by mouth daily at 12 noon. 30 tablet 11    promethazine (PHENERGAN) 12.5 MG tablet Take 1 tablet (12.5 mg total) by mouth every 6 (six) hours as needed for nausea or vomiting. 30 tablet 5      Review of Systems   All systems reviewed and negative except as stated in HPI  Blood pressure 119/75, pulse 100, temperature 98.5 F (36.9 C), resp. rate (!) 22, last menstrual period 04/11/2021, SpO2 99 %. General appearance: alert Lungs: clear to auscultation bilaterally Heart: regular rate and rhythm Abdomen: soft,  Presentation: cephalic Fetal monitoring: 130bpm/Moderate variability/ 15x15 accels/ None decels  Uterine activityFrequency: Every 2 minutes, not strong Dilation: 1.5 Effacement (%): 50 Station: -3 Exam by:: Santiago Bur, RN   Prenatal labs: ABO, Rh: B/Positive/-- (02/01 1630) Antibody: Negative (02/01 1630) Rubella: 6.71 (02/01 1630) RPR: Non Reactive (05/30 0834)  HBsAg: Negative (02/01 1630)  HIV: Non Reactive (05/30 0834)  GBS: Negative/-- (07/27 1602)  2 hr Glucola nl Genetic screening  normal Anatomy US EIF   Results for orders placed or performed during the hospital encounter of 01/15/22 (from the past 24 hour(s))  POCT fern test   Collection Time: 01/15/22 11:07 PM  Result Value Ref Range   POCT Fern Test Positive = ruptured amniotic membanes   Fern Test   Collection Time: 01/15/22 11:17 PM  Result Value Ref Range   POCT Fern Test Positive = ruptured amniotic membanes     Patient Active Problem List   Diagnosis Date Noted   Carpal tunnel syndrome 12/20/2021   Echogenic intracardiac focus of fetus on prenatal ultrasound 09/03/2021    Supervision of other normal pregnancy, antepartum 06/20/2021    Assessment/Plan:  Felicia Lucas is a 26 y.o. G2P0010 at [redacted]w[redacted]d here for PROM  #Labor: PROM at 2000. Start with cytotec and consider foley balloon. #Pain: Desires epidural #FWB: Cat 1 #ID:  GBS  negative #MOF: both #MOC:POPs #Circ:  girl  Ilda Basset, MD  [facility] FM PGY3 Visiting Resident 01/15/2022, 11:20 PM  I spoke with and examined patient and agree with resident/PA-S/MS/SNM's note and plan of care.  Cheral Marker, CNM, WHNP-BC 01/16/2022 1:46 AM

## 2022-01-15 NOTE — Telephone Encounter (Signed)
Preadmission screen  

## 2022-01-15 NOTE — MAU Note (Signed)
.  Felicia Lucas is a 26 y.o. at [redacted]w[redacted]d here in MAU reporting: Pt reports her water broke at 2000 clear fluid.  Denies vaginal bleeding Reports decreased fetal movement for the last hour.   Onset of complaint: today 2000 Pain score: 7/10 intermittent ctx in abdomen  There were no vitals filed for this visit.    Lab orders placed from triage:   none

## 2022-01-16 ENCOUNTER — Inpatient Hospital Stay (HOSPITAL_COMMUNITY): Payer: Medicaid Other | Admitting: Anesthesiology

## 2022-01-16 ENCOUNTER — Encounter: Payer: Medicaid Other | Admitting: Family Medicine

## 2022-01-16 ENCOUNTER — Other Ambulatory Visit: Payer: Medicaid Other

## 2022-01-16 LAB — CBC WITH DIFFERENTIAL/PLATELET
Abs Immature Granulocytes: 0.08 10*3/uL — ABNORMAL HIGH (ref 0.00–0.07)
Basophils Absolute: 0 10*3/uL (ref 0.0–0.1)
Basophils Relative: 0 %
Eosinophils Absolute: 0 10*3/uL (ref 0.0–0.5)
Eosinophils Relative: 0 %
HCT: 36.6 % (ref 36.0–46.0)
Hemoglobin: 12.5 g/dL (ref 12.0–15.0)
Immature Granulocytes: 1 %
Lymphocytes Relative: 8 %
Lymphs Abs: 1.1 10*3/uL (ref 0.7–4.0)
MCH: 28.2 pg (ref 26.0–34.0)
MCHC: 34.2 g/dL (ref 30.0–36.0)
MCV: 82.4 fL (ref 80.0–100.0)
Monocytes Absolute: 1 10*3/uL (ref 0.1–1.0)
Monocytes Relative: 7 %
Neutro Abs: 12.6 10*3/uL — ABNORMAL HIGH (ref 1.7–7.7)
Neutrophils Relative %: 84 %
Platelets: 240 10*3/uL (ref 150–400)
RBC: 4.44 MIL/uL (ref 3.87–5.11)
RDW: 14.5 % (ref 11.5–15.5)
WBC: 14.8 10*3/uL — ABNORMAL HIGH (ref 4.0–10.5)
nRBC: 0 % (ref 0.0–0.2)

## 2022-01-16 LAB — COMPREHENSIVE METABOLIC PANEL
ALT: 12 U/L (ref 0–44)
AST: 19 U/L (ref 15–41)
Albumin: 3.3 g/dL — ABNORMAL LOW (ref 3.5–5.0)
Alkaline Phosphatase: 150 U/L — ABNORMAL HIGH (ref 38–126)
Anion gap: 9 (ref 5–15)
BUN: <5 mg/dL — ABNORMAL LOW (ref 6–20)
CO2: 21 mmol/L — ABNORMAL LOW (ref 22–32)
Calcium: 9.5 mg/dL (ref 8.9–10.3)
Chloride: 103 mmol/L (ref 98–111)
Creatinine, Ser: 0.82 mg/dL (ref 0.44–1.00)
GFR, Estimated: >60 mL/min (ref >60)
Glucose, Bld: 96 mg/dL (ref 70–99)
Potassium: 3.4 mmol/L — ABNORMAL LOW (ref 3.5–5.1)
Sodium: 133 mmol/L — ABNORMAL LOW (ref 135–145)
Total Bilirubin: 1.3 mg/dL — ABNORMAL HIGH (ref 0.3–1.2)
Total Protein: 7.1 g/dL (ref 6.5–8.1)

## 2022-01-16 LAB — TYPE AND SCREEN
ABO/RH(D): B POS
Antibody Screen: NEGATIVE

## 2022-01-16 LAB — URINALYSIS, ROUTINE W REFLEX MICROSCOPIC
Bilirubin Urine: NEGATIVE
Glucose, UA: NEGATIVE mg/dL
Ketones, ur: 5 mg/dL — AB
Nitrite: NEGATIVE
Protein, ur: 30 mg/dL — AB
Specific Gravity, Urine: 1.005 (ref 1.005–1.030)
pH: 6 (ref 5.0–8.0)

## 2022-01-16 LAB — SARS CORONAVIRUS 2 BY RT PCR: SARS Coronavirus 2 by RT PCR: NEGATIVE

## 2022-01-16 LAB — CBC
HCT: 34.4 % — ABNORMAL LOW (ref 36.0–46.0)
Hemoglobin: 11.1 g/dL — ABNORMAL LOW (ref 12.0–15.0)
MCH: 27.1 pg (ref 26.0–34.0)
MCHC: 32.3 g/dL (ref 30.0–36.0)
MCV: 84.1 fL (ref 80.0–100.0)
Platelets: 265 10*3/uL (ref 150–400)
RBC: 4.09 MIL/uL (ref 3.87–5.11)
RDW: 14.6 % (ref 11.5–15.5)
WBC: 9 10*3/uL (ref 4.0–10.5)
nRBC: 0 % (ref 0.0–0.2)

## 2022-01-16 LAB — PROTIME-INR
INR: 1.1 (ref 0.8–1.2)
Prothrombin Time: 14 seconds (ref 11.4–15.2)

## 2022-01-16 LAB — APTT: aPTT: 27 seconds (ref 24–36)

## 2022-01-16 LAB — RPR: RPR Ser Ql: NONREACTIVE

## 2022-01-16 LAB — LACTIC ACID, PLASMA: Lactic Acid, Venous: 1.8 mmol/L (ref 0.5–1.9)

## 2022-01-16 MED ORDER — TERBUTALINE SULFATE 1 MG/ML IJ SOLN: 0.2500 mg | Freq: Once | INTRAMUSCULAR | Status: DC | PRN

## 2022-01-16 MED ORDER — MISOPROSTOL 50MCG HALF TABLET
50.0000 ug | ORAL_TABLET | ORAL | Status: DC | PRN
  Administered 2022-01-16: 50 ug via ORAL
  Filled 2022-01-16: qty 1

## 2022-01-16 MED ORDER — LACTATED RINGERS IV BOLUS (SEPSIS)
800.0000 mL | Freq: Once | INTRAVENOUS | Status: AC
  Administered 2022-01-16: 800 mL via INTRAVENOUS

## 2022-01-16 MED ORDER — MISOPROSTOL 25 MCG QUARTER TABLET
25.0000 ug | ORAL_TABLET | ORAL | Status: DC | PRN
Start: 2022-01-16 — End: 2022-01-16

## 2022-01-16 MED ORDER — EPHEDRINE 5 MG/ML INJ
10.0000 mg | INTRAVENOUS | Status: DC | PRN
Start: 2022-01-16 — End: 2022-01-17

## 2022-01-16 MED ORDER — LIDOCAINE-EPINEPHRINE (PF) 1.5 %-1:200000 IJ SOLN
INTRAMUSCULAR | Status: DC | PRN
  Administered 2022-01-16: 5 mL via EPIDURAL

## 2022-01-16 MED ORDER — EPHEDRINE 5 MG/ML INJ: 10.0000 mg | INTRAVENOUS | Status: DC | PRN

## 2022-01-16 MED ORDER — PHENYLEPHRINE 80 MCG/ML (10ML) SYRINGE FOR IV PUSH (FOR BLOOD PRESSURE SUPPORT)
80.0000 ug | PREFILLED_SYRINGE | INTRAVENOUS | Status: DC | PRN
Start: 2022-01-16 — End: 2022-01-17

## 2022-01-16 MED ORDER — GENTAMICIN SULFATE 40 MG/ML IJ SOLN
5.0000 mg/kg | Freq: Once | INTRAVENOUS | Status: AC
  Administered 2022-01-16: 380 mg via INTRAVENOUS
  Filled 2022-01-16: qty 9.5

## 2022-01-16 MED ORDER — FENTANYL-BUPIVACAINE-NACL 0.5-0.125-0.9 MG/250ML-% EP SOLN
12.0000 mL/h | EPIDURAL | Status: DC | PRN
  Administered 2022-01-16: 12 mL/h via EPIDURAL
  Filled 2022-01-16 (×2): qty 250

## 2022-01-16 MED ORDER — OXYTOCIN-SODIUM CHLORIDE 30-0.9 UT/500ML-% IV SOLN
1.0000 m[IU]/min | INTRAVENOUS | Status: DC
  Administered 2022-01-16: 2 m[IU]/min via INTRAVENOUS
  Filled 2022-01-16: qty 500

## 2022-01-16 MED ORDER — ACETAMINOPHEN 500 MG PO TABS
1000.0000 mg | ORAL_TABLET | Freq: Four times a day (QID) | ORAL | Status: DC | PRN
  Administered 2022-01-16 (×3): 1000 mg via ORAL
  Filled 2022-01-16 (×3): qty 2

## 2022-01-16 MED ORDER — LACTATED RINGERS IV SOLN: 500.0000 mL | Freq: Once | INTRAVENOUS | Status: DC

## 2022-01-16 MED ORDER — PHENYLEPHRINE 80 MCG/ML (10ML) SYRINGE FOR IV PUSH (FOR BLOOD PRESSURE SUPPORT)
80.0000 ug | PREFILLED_SYRINGE | INTRAVENOUS | Status: AC | PRN
  Administered 2022-01-16 (×3): 80 ug via INTRAVENOUS
  Filled 2022-01-16: qty 10

## 2022-01-16 MED ORDER — LACTATED RINGERS IV BOLUS (SEPSIS)
1000.0000 mL | Freq: Once | INTRAVENOUS | Status: AC
  Administered 2022-01-16: 1000 mL via INTRAVENOUS

## 2022-01-16 MED ORDER — DIPHENHYDRAMINE HCL 50 MG/ML IJ SOLN: 12.5000 mg | INTRAMUSCULAR | Status: DC | PRN

## 2022-01-16 MED ORDER — AMPICILLIN SODIUM 2 G IJ SOLR
2.0000 g | Freq: Four times a day (QID) | INTRAMUSCULAR | Status: DC
  Administered 2022-01-16 – 2022-01-17 (×3): 2 g via INTRAVENOUS
  Filled 2022-01-16 (×3): qty 2000

## 2022-01-16 NOTE — Progress Notes (Signed)
Felicia Lucas is a 26 y.o. G2P0010 at [redacted]w[redacted]d by LMP admitted for PROM.   Subjective: Introductions exchanged. Patient doing well. Very comfortable with epidural. Experiencing some nausea, but overall doing well.   Objective: BP 101/75 (BP Location: Right Arm)   Pulse 98   Temp 100 F (37.8 C) (Axillary)   Resp 18   Ht 5\' 5"  (1.651 m)   Wt 103.4 kg   LMP 04/11/2021   SpO2 100%   BMI 37.94 kg/m  No intake/output data recorded. No intake/output data recorded.  FHT:  FHR: 145 bpm, variability: minimal ,  accelerations:  Present,  decelerations: occasional early decelerations  UC:   regular, every 2-3 minutes SVE:   Dilation: 2 Effacement (%): 60, 70 Station: -2 Exam by:: [email or web address], RN  Labs: Lab Results  Component Value Date   WBC 9.0 01/15/2022   HGB 11.1 (L) 01/15/2022   HCT 34.4 (L) 01/15/2022   MCV 84.1 01/15/2022   PLT 265 01/15/2022    Assessment / Plan: Augmentation of labor, progressing well s/p dual cytotec. Last dose was at 0800. Foley bulb still in place.   Labor:  Progressing well. Plan for pit once foley balloon is expelled.  Fetal Wellbeing:  Category II- Continuous Monitoring for signs of fetal distress.  Pain Control:  Epidural I/D:   GBS Negative  Anticipated MOD:  NSVD  01/17/2022, CNM 01/16/2022, 9:50 AM

## 2022-01-16 NOTE — Anesthesia Preprocedure Evaluation (Signed)
Anesthesia Evaluation  Patient identified by MRN, date of birth, ID band Patient awake    Reviewed: Allergy & Precautions, NPO status , Patient's Chart, lab work & pertinent test results  Airway Mallampati: II  TM Distance: >3 FB Neck ROM: Full    Dental no notable dental hx.    Pulmonary neg pulmonary ROS, former smoker,    Pulmonary exam normal        Cardiovascular negative cardio ROS   Rhythm:Regular Rate:Normal     Neuro/Psych negative neurological ROS  negative psych ROS   GI/Hepatic negative GI ROS, Neg liver ROS,   Endo/Other  negative endocrine ROS  Renal/GU negative Renal ROS  negative genitourinary   Musculoskeletal negative musculoskeletal ROS (+)   Abdominal Normal abdominal exam  (+)   Peds  Hematology negative hematology ROS (+)   Anesthesia Other Findings   Reproductive/Obstetrics (+) Pregnancy                             Anesthesia Physical Anesthesia Plan  ASA: 2  Anesthesia Plan: Epidural   Post-op Pain Management:    Induction:   PONV Risk Score and Plan: 2 and Treatment may vary due to age or medical condition  Airway Management Planned: Natural Airway  Additional Equipment: None  Intra-op Plan:   Post-operative Plan:   Informed Consent: I have reviewed the patients History and Physical, chart, labs and discussed the procedure including the risks, benefits and alternatives for the proposed anesthesia with the patient or authorized representative who has indicated his/her understanding and acceptance.     Dental advisory given  Plan Discussed with:   Anesthesia Plan Comments: (Lab Results      Component                Value               Date                      WBC                      9.0                 01/15/2022                HGB                      11.1 (L)            01/15/2022                HCT                      34.4 (L)             01/15/2022                MCV                      84.1                01/15/2022                PLT                      265  01/15/2022          )        Anesthesia Quick Evaluation

## 2022-01-16 NOTE — Progress Notes (Signed)
Labor Progress Note Felicia Lucas is a 26 y.o. G2P0010 at [redacted]w[redacted]d presented for PROM S: Comfortable on epidural  O:  BP 100/62   Pulse 89   Temp 100.3 F (37.9 C) (Axillary)   Resp 17   Ht 5\' 5"  (1.651 m)   Wt 103.4 kg   LMP 04/11/2021   SpO2 100%   BMI 37.94 kg/m  EFM: 150bpm/Moderate variability/ 15x15 accels/ None decels   CVE: Dilation: 2 Effacement (%): 70 Cervical Position: Posterior Station: -2 Presentation: Vertex Exam by:: Dr. [email or web address]   A&P: 26 y.o. G2P0010 [redacted]w[redacted]d PROM #Labor: PROM at 2000 8/22. S/p cytotec. Progressing well. Placed foley at 0610 along with 50 mcg oral cytotec #Pain:  Epidural #FWB: Cat 1 #ID:  GBS negative #MOF: both #MOC: POPs #Circ:   girl  12-14-1986, MD [facility] FM PGY3 Visiting Resident 6:47 AM

## 2022-01-16 NOTE — Progress Notes (Signed)
Labor Progress Note Felicia Lucas is a 26 y.o. G2P0010 at [redacted]w[redacted]d presented for PROM  S: Pt feeling L side pain with contractions  O:  BP 102/63   Pulse (!) 102   Temp 100.2 F (37.9 C) (Axillary)   Resp 16   Ht 5\' 5"  (1.651 m)   Wt 103.4 kg   LMP 04/11/2021   SpO2 99%   BMI 37.94 kg/m  EFM: 130bpm/Moderate variability/ 15x15 accels/ None decels  CVE: Dilation: Lip/rim Effacement (%): 100 Cervical Position: Posterior Station: Plus 2 Presentation: Vertex Exam by:: Dr. [email or web address]   A&P: 26 y.o. G2P0010 [redacted]w[redacted]d PROM, now past 24h  #Labor: Progressing well. Continue position changes #Pain: Epidural #FWB: CAT 1 #GBS negative  #Sepsis vs Intramniotic infection: Prior fevers of 100.51F-101.40F, hypotension. Sepsis workup ongoing, abx amp/gent. Pt afebrile now and normotensive. Maternal tahcycardia present. CTM  [redacted]w[redacted]d, DO 9:25 PM

## 2022-01-16 NOTE — Progress Notes (Signed)
Felicia Lucas is a 26 y.o. G2P0010 at [redacted]w[redacted]d by LMP admitted for PROM since 2000 on 8/22.   Subjective: Patient resting well.   Objective: BP 116/84   Pulse (!) 121   Temp (!) 100.8 F (38.2 C) (Axillary)   Resp 16   Ht 5\' 5"  (1.651 m)   Wt 103.4 kg   LMP 04/11/2021   SpO2 99%   BMI 37.94 kg/m  No intake/output data recorded. Total I/O In: -  Out: 2700 [Urine:2700]  FHT:  FHR: 140 bpm, variability: moderate,  accelerations:  Abscent,  decelerations:  Absent UC:   irregular, every 3-6 minutes SVE:   Dilation: 6 Effacement (%): 90 Station: -1 Exam by:: [email or web address], CNM  Labs: Lab Results  Component Value Date   WBC 14.8 (H) 01/16/2022   HGB 12.5 01/16/2022   HCT 36.6 01/16/2022   MCV 82.4 01/16/2022   PLT 240 01/16/2022   Lab Orders         SARS Coronavirus 2 by RT PCR (hospital order, performed in [facility] hospital lab) *cepheid single result test* Anterior Nasal Swab         Culture, blood (x 2)         Culture, OB Urine         CBC         RPR         CBC with Differential         Comprehensive metabolic panel         Protime-INR         APTT         Urinalysis, Routine w reflex microscopic         POCT fern test         [facility] Test    Results for orders placed or performed during the hospital encounter of 01/15/22 (from the past 24 hour(s))  POCT fern test     Status: Abnormal   Collection Time: 01/15/22 11:07 PM  Result Value Ref Range   POCT Fern Test Positive = ruptured amniotic membanes   CBC     Status: Abnormal   Collection Time: 01/15/22 11:11 PM  Result Value Ref Range   WBC 9.0 4.0 - 10.5 K/uL   RBC 4.09 3.87 - 5.11 MIL/uL   Hemoglobin 11.1 (L) 12.0 - 15.0 g/dL   HCT 01/17/22 (L) 91.4 - 78.2 %   MCV 84.1 80.0 - 100.0 fL   MCH 27.1 26.0 - 34.0 pg   MCHC 32.3 30.0 - 36.0 g/dL   RDW 95.6 21.3 - 08.6 %   Platelets 265 150 - 400 K/uL   nRBC 0.0 0.0 - 0.2 %  Type and screen     Status: None   Collection Time: 01/15/22 11:11 PM  Result Value  Ref Range   ABO/RH(D) B POS    Antibody Screen NEG    Sample Expiration      01/18/2022,2359 Performed at [facility], [address] [place], [place]   RPR     Status: None   Collection Time: 01/15/22 11:11 PM  Result Value Ref Range   RPR Ser Ql NON REACTIVE NON REACTIVE  Fern Test     Status: None   Collection Time: 01/15/22 11:17 PM  Result Value Ref Range   POCT Fern Test Positive = ruptured amniotic membanes   SARS Coronavirus 2 by RT PCR (hospital order, performed in [facility] hospital lab) *cepheid single result test*  Anterior Nasal Swab     Status: None   Collection Time: 01/16/22  8:36 AM   Specimen: Anterior Nasal Swab  Result Value Ref Range   SARS Coronavirus 2 by RT PCR NEGATIVE NEGATIVE  CBC with Differential     Status: Abnormal   Collection Time: 01/16/22  5:20 PM  Result Value Ref Range   WBC 14.8 (H) 4.0 - 10.5 K/uL   RBC 4.44 3.87 - 5.11 MIL/uL   Hemoglobin 12.5 12.0 - 15.0 g/dL   HCT 38.4 53.6 - 46.8 %   MCV 82.4 80.0 - 100.0 fL   MCH 28.2 26.0 - 34.0 pg   MCHC 34.2 30.0 - 36.0 g/dL   RDW 03.2 12.2 - 48.2 %   Platelets 240 150 - 400 K/uL   nRBC 0.0 0.0 - 0.2 %   Neutrophils Relative % 84 %   Neutro Abs 12.6 (H) 1.7 - 7.7 K/uL   Lymphocytes Relative 8 %   Lymphs Abs 1.1 0.7 - 4.0 K/uL   Monocytes Relative 7 %   Monocytes Absolute 1.0 0.1 - 1.0 K/uL   Eosinophils Relative 0 %   Eosinophils Absolute 0.0 0.0 - 0.5 K/uL   Basophils Relative 0 %   Basophils Absolute 0.0 0.0 - 0.1 K/uL   Immature Granulocytes 1 %   Abs Immature Granulocytes 0.08 (H) 0.00 - 0.07 K/uL  Comprehensive metabolic panel     Status: Abnormal   Collection Time: 01/16/22  5:20 PM  Result Value Ref Range   Sodium 133 (L) 135 - 145 mmol/L   Potassium 3.4 (L) 3.5 - 5.1 mmol/L   Chloride 103 98 - 111 mmol/L   CO2 21 (L) 22 - 32 mmol/L   Glucose, Bld 96 70 - 99 mg/dL   BUN <5 (L) 6 - 20 mg/dL   Creatinine, Ser 5.00 0.44 - 1.00 mg/dL   Calcium 9.5 8.9 - 37.0  mg/dL   Total Protein 7.1 6.5 - 8.1 g/dL   Albumin 3.3 (L) 3.5 - 5.0 g/dL   AST 19 15 - 41 U/L   ALT 12 0 - 44 U/L   Alkaline Phosphatase 150 (H) 38 - 126 U/L   Total Bilirubin 1.3 (H) 0.3 - 1.2 mg/dL   GFR, Estimated >48 >88 mL/min   Anion gap 9 5 - 15  Lactic acid, plasma     Status: None   Collection Time: 01/16/22  5:20 PM  Result Value Ref Range   Lactic Acid, Venous 1.8 0.5 - 1.9 mmol/L  Protime-INR     Status: None   Collection Time: 01/16/22  5:20 PM  Result Value Ref Range   Prothrombin Time 14.0 11.4 - 15.2 seconds   INR 1.1 0.8 - 1.2  APTT     Status: None   Collection Time: 01/16/22  5:20 PM  Result Value Ref Range   aPTT 27 24 - 36 seconds  Urinalysis, Routine w reflex microscopic Urine, Catheterized     Status: Abnormal   Collection Time: 01/16/22  6:00 PM  Result Value Ref Range   Color, Urine YELLOW YELLOW   APPearance CLOUDY (A) CLEAR   Specific Gravity, Urine 1.005 1.005 - 1.030   pH 6.0 5.0 - 8.0   Glucose, UA NEGATIVE NEGATIVE mg/dL   Hgb urine dipstick MODERATE (A) NEGATIVE   Bilirubin Urine NEGATIVE NEGATIVE   Ketones, ur 5 (A) NEGATIVE mg/dL   Protein, ur 30 (A) NEGATIVE mg/dL   Nitrite NEGATIVE NEGATIVE   Leukocytes,Ua SMALL (A)  NEGATIVE   RBC / HPF 0-5 0 - 5 RBC/hpf   WBC, UA 6-10 0 - 5 WBC/hpf   Bacteria, UA FEW (A) NONE SEEN   Squamous Epithelial / LPF 0-5 0 - 5   Mucus PRESENT    Hyaline Casts, UA PRESENT    Patient Vitals for the past 24 hrs:  BP Temp Temp src Pulse Resp SpO2 Height Weight  01/16/22 1902 102/63 -- -- (!) 102 -- -- -- --  01/16/22 1845 -- (!) 100.8 F (38.2 C) Axillary -- -- -- -- --  01/16/22 1803 116/84 -- -- (!) 121 -- -- -- --  01/16/22 1702 96/61 (!) 100.8 F (38.2 C) Axillary 92 -- -- -- --  01/16/22 1638 101/77 -- -- (!) 106 -- -- -- --  01/16/22 1602 (!) 77/57 -- -- 97 16 -- -- --  01/16/22 1515 104/60 -- -- 93 -- -- -- --  01/16/22 1510 (!) 88/55 -- -- 84 -- -- -- --  01/16/22 1508 (!) 78/40 -- -- 84 -- -- --  --  01/16/22 1505 (!) 88/45 99.9 F (37.7 C) Axillary 89 -- -- -- --  01/16/22 1502 (!) 79/50 -- -- 96 -- -- -- --  01/16/22 1413 (!) 99/44 -- -- 89 -- -- -- --  01/16/22 1400 -- -- -- -- 16 -- -- --  01/16/22 1340 -- (!) 100.8 F (38.2 C) -- -- -- -- -- --  01/16/22 1303 (!) 96/56 -- -- 92 16 -- -- --  01/16/22 1201 96/63 (!) 101.9 F (38.8 C) Axillary 97 -- -- -- --  01/16/22 1101 107/67 -- -- (!) 127 20 -- -- --  01/16/22 1002 92/73 99.8 F (37.7 C) Axillary (!) 117 -- -- -- --  01/16/22 0921 -- -- -- -- -- 99 % -- --  01/16/22 0917 118/79 -- -- (!) 110 -- -- -- --  01/16/22 0902 (!) 75/34 -- -- (!) 109 -- -- -- --  01/16/22 0804 101/75 -- -- 98 18 -- -- --  01/16/22 0753 (!) 79/36 -- -- 100 18 -- -- --  01/16/22 0751 (!) 86/45 100 F (37.8 C) Axillary 98 18 -- -- --  01/16/22 0632 97/78 -- -- 93 -- -- -- --  01/16/22 0617 -- 100.3 F (37.9 C) Axillary -- -- -- -- --  01/16/22 0600 100/62 -- -- 89 -- -- -- --  01/16/22 0533 112/75 -- -- 87 -- -- -- --  01/16/22 0500 102/71 -- -- 96 -- -- -- --  01/16/22 0425 94/63 -- -- 89 -- -- -- --  01/16/22 0420 101/62 -- -- 82 -- -- -- --  01/16/22 0415 98/68 -- -- 91 -- -- -- --  01/16/22 0411 93/61 99.5 F (37.5 C) Oral 99 -- -- -- --  01/16/22 0410 -- -- -- -- -- 100 % -- --  01/16/22 0405 112/79 -- -- (!) 102 -- 100 % -- --  01/16/22 0400 120/76 -- -- (!) 104 -- 100 % -- --  01/16/22 0355 132/68 -- -- (!) 115 -- 99 % -- --  01/16/22 0330 (!) 116/92 -- -- (!) 101 -- -- -- --  01/16/22 0300 118/80 -- -- 99 -- -- -- --  01/16/22 0240 116/77 -- -- 97 -- -- -- --  01/16/22 0236 116/77 99.1 F (37.3 C) Oral 97 17 99 % -- --  01/15/22 2335 113/64 98.3 F (36.8 C) Oral 95 17 99 % 5\' 5"  (1.651 m)  103.4 kg  01/15/22 2257 119/75 98.5 F (36.9 C) -- 100 (!) 22 99 % -- --     Assessment / Plan: Augmentation of labor, progressing on Pit up to 4 m/u. Patient becoming febrile again and has ruled in for a sepsis workup based on Reoccurring  BP <100 / <60, and new onset maternal tachycardia, in the presence of consistent fever >100.6. Fever improves with Tylenol . WBC increased from 9 to 14. UA reflexed to culture. Lactic acid <2.     Labor: Progressing on Pitocin, will continue to increase. May place an IUPC if no cervical change on next exam.  Fetal Wellbeing:  Category I- Continue to monitor for signs of fetal distress.  Pain Control:  Epidural- being managed well.   I/D:   [place] on Amp & Gent. May consider other antibiotics based on  completed Sepsis protocol.  Anticipated MOD:  NSVD  Claudette Head, CNM 01/16/2022, 7:00 PM

## 2022-01-16 NOTE — Anesthesia Procedure Notes (Signed)
Epidural Patient location during procedure: OB Start time: 01/16/2022 3:55 AM End time: 01/16/2022 4:02 AM  Staffing Anesthesiologist: Atilano Median, DO Performed: anesthesiologist   Preanesthetic Checklist Completed: patient identified, IV checked, site marked, risks and benefits discussed, surgical consent, monitors and equipment checked, pre-op evaluation and timeout performed  Epidural Patient position: sitting Prep: ChloraPrep Patient monitoring: heart rate, continuous pulse ox and blood pressure Approach: midline Location: L4-L5 Injection technique: LOR saline  Needle:  Needle type: Tuohy  Needle gauge: 17 G Needle length: 9 cm Needle insertion depth: 8 cm Catheter type: closed end flexible Catheter size: 20 Guage Catheter at skin depth: 13 cm Test dose: negative and 1.5% lidocaine with Epi 1:200 K  Assessment Events: blood not aspirated, injection not painful, no injection resistance and no paresthesia  Additional Notes Patient identified. Risks/Benefits/Options discussed with patient including but not limited to bleeding, infection, nerve damage, paralysis, failed block, incomplete pain control, headache, blood pressure changes, nausea, vomiting, reactions to medications, itching and postpartum back pain. Confirmed with bedside nurse the patient's most recent platelet count. Confirmed with patient that they are not currently taking any anticoagulation, have any bleeding history or any family history of bleeding disorders. Patient expressed understanding and wished to proceed. All questions were answered. Sterile technique was used throughout the entire procedure. Please see nursing notes for vital signs. Test dose was given through epidural catheter and negative prior to continuing to dose epidural or start infusion. Warning signs of high block given to the patient including shortness of breath, tingling/numbness in hands, complete motor block, or any concerning  symptoms with instructions to call for help. Patient was given instructions on fall risk and not to get out of bed. All questions and concerns addressed with instructions to call with any issues or inadequate analgesia.    Reason for block:procedure for pain

## 2022-01-16 NOTE — Progress Notes (Signed)
Felicia Lucas is a 26 y.o. G2P0010 at [redacted]w[redacted]d by LMP admitted for PROM since 2000 on 01/15/22.   Subjective: Patient doing well. Nausea resolved. Epidural one-sided and turning from side to side.   Objective: BP 96/63   Pulse 97   Temp 99.8 F (37.7 C) (Axillary)   Resp 20   Ht 5\' 5"  (1.651 m)   Wt 103.4 kg   LMP 04/11/2021   SpO2 99%   BMI 37.94 kg/m  No intake/output data recorded. No intake/output data recorded.  FHT:  FHR: 150 bpm, variability: moderate,  accelerations:  Present,  decelerations:  Present occasional early decels with contractions.  UC:   regular, every 2-4 minutes SVE:   Dilation: 5 Effacement (%): 70 Station: -2, -1 Exam by:: Shay  Labs: Lab Results  Component Value Date   WBC 9.0 01/15/2022   HGB 11.1 (L) 01/15/2022   HCT 34.4 (L) 01/15/2022   MCV 84.1 01/15/2022   PLT 265 01/15/2022    Assessment / Plan: Augmentation of labor, progressing well s/p  S/p FB out at 1030am. RN notified CNM of patient last temp of 101.2. PO Tylenol 1000mg  ordered. S/p SROM last night at 8pm. Was clear fluid, now light mec stained.  Amp and Gent ABX for Chorioamnionitis initiated.   Labor: Progressing well. Initiating Pit 2x2, Continue to titrate appropriately as needed for adequate contraction pattern.  will continue to increase then AROM Fetal Wellbeing:  Category II- Continue to monitor  Pain Control:  Epidural- working well  I/D:   Suspicion for Chorioamnionitis- Amp and Gent ordered . Continue to monitor Temp and fetal presentation.    Anticipated MOD:  NSVD  01/17/2022, CNM 01/16/2022, 12:34 PM

## 2022-01-17 ENCOUNTER — Encounter (HOSPITAL_COMMUNITY): Payer: Self-pay | Admitting: Family Medicine

## 2022-01-17 DIAGNOSIS — O4202 Full-term premature rupture of membranes, onset of labor within 24 hours of rupture: Secondary | ICD-10-CM

## 2022-01-17 DIAGNOSIS — O368130 Decreased fetal movements, third trimester, not applicable or unspecified: Secondary | ICD-10-CM

## 2022-01-17 DIAGNOSIS — Z3A40 40 weeks gestation of pregnancy: Secondary | ICD-10-CM

## 2022-01-17 LAB — CBC WITH DIFFERENTIAL/PLATELET
Abs Immature Granulocytes: 0.23 10*3/uL — ABNORMAL HIGH (ref 0.00–0.07)
Basophils Absolute: 0 10*3/uL (ref 0.0–0.1)
Basophils Relative: 0 %
Eosinophils Absolute: 0 10*3/uL (ref 0.0–0.5)
Eosinophils Relative: 0 %
HCT: 30.3 % — ABNORMAL LOW (ref 36.0–46.0)
Hemoglobin: 10.3 g/dL — ABNORMAL LOW (ref 12.0–15.0)
Immature Granulocytes: 1 %
Lymphocytes Relative: 5 %
Lymphs Abs: 1 10*3/uL (ref 0.7–4.0)
MCH: 27.7 pg (ref 26.0–34.0)
MCHC: 34 g/dL (ref 30.0–36.0)
MCV: 81.5 fL (ref 80.0–100.0)
Monocytes Absolute: 1 10*3/uL (ref 0.1–1.0)
Monocytes Relative: 5 %
Neutro Abs: 18.3 10*3/uL — ABNORMAL HIGH (ref 1.7–7.7)
Neutrophils Relative %: 89 %
Platelets: 200 10*3/uL (ref 150–400)
RBC: 3.72 MIL/uL — ABNORMAL LOW (ref 3.87–5.11)
RDW: 14.5 % (ref 11.5–15.5)
WBC: 20.5 10*3/uL — ABNORMAL HIGH (ref 4.0–10.5)
nRBC: 0 % (ref 0.0–0.2)

## 2022-01-17 LAB — CULTURE, OB URINE: Culture: NO GROWTH

## 2022-01-17 MED ORDER — ONDANSETRON HCL 4 MG/2ML IJ SOLN: 4.0000 mg | INTRAMUSCULAR | Status: DC | PRN

## 2022-01-17 MED ORDER — ACETAMINOPHEN 325 MG PO TABS: 650.0000 mg | ORAL_TABLET | ORAL | Status: DC | PRN

## 2022-01-17 MED ORDER — DIPHENHYDRAMINE HCL 25 MG PO CAPS: 25.0000 mg | ORAL_CAPSULE | Freq: Four times a day (QID) | ORAL | Status: DC | PRN

## 2022-01-17 MED ORDER — COCONUT OIL OIL: 1.0000 | TOPICAL_OIL | Status: DC | PRN

## 2022-01-17 MED ORDER — WITCH HAZEL-GLYCERIN EX PADS: 1.0000 | MEDICATED_PAD | CUTANEOUS | Status: DC | PRN

## 2022-01-17 MED ORDER — SIMETHICONE 80 MG PO CHEW: 80.0000 mg | CHEWABLE_TABLET | ORAL | Status: DC | PRN

## 2022-01-17 MED ORDER — BENZOCAINE-MENTHOL 20-0.5 % EX AERO: 1.0000 | INHALATION_SPRAY | CUTANEOUS | Status: DC | PRN

## 2022-01-17 MED ORDER — PRENATAL MULTIVITAMIN CH
1.0000 | ORAL_TABLET | Freq: Every day | ORAL | Status: DC
  Administered 2022-01-17 – 2022-01-19 (×3): 1 via ORAL
  Filled 2022-01-17 (×3): qty 1

## 2022-01-17 MED ORDER — IBUPROFEN 600 MG PO TABS
600.0000 mg | ORAL_TABLET | Freq: Four times a day (QID) | ORAL | Status: DC
Start: 2022-01-17 — End: 2022-01-19
  Administered 2022-01-17 – 2022-01-19 (×10): 600 mg via ORAL
  Filled 2022-01-17 (×10): qty 1

## 2022-01-17 MED ORDER — TETANUS-DIPHTH-ACELL PERTUSSIS 5-2.5-18.5 LF-MCG/0.5 IM SUSY: 0.5000 mL | PREFILLED_SYRINGE | Freq: Once | INTRAMUSCULAR | Status: DC

## 2022-01-17 MED ORDER — BENZOCAINE 10 % MT GEL: 1.0000 | OROMUCOSAL | Status: DC | PRN

## 2022-01-17 MED ORDER — DIBUCAINE (PERIANAL) 1 % EX OINT: 1.0000 | TOPICAL_OINTMENT | CUTANEOUS | Status: DC | PRN

## 2022-01-17 MED ORDER — ONDANSETRON HCL 4 MG PO TABS: 4.0000 mg | ORAL_TABLET | ORAL | Status: DC | PRN

## 2022-01-17 MED ORDER — SENNOSIDES-DOCUSATE SODIUM 8.6-50 MG PO TABS
2.0000 | ORAL_TABLET | Freq: Every day | ORAL | Status: DC
  Administered 2022-01-18: 2 via ORAL
  Filled 2022-01-17 (×2): qty 2

## 2022-01-17 NOTE — Progress Notes (Signed)
RN notified that patient was feeling pressure. Patient progressed to complete. SVE 10/100/+2. Plan on laboring down. 135bpm/Moderate variability/ none accels/ None decels.  Ilda Basset, MD [facility] FM PGY3 Visiting Resident

## 2022-01-17 NOTE — Discharge Summary (Signed)
Postpartum Discharge Summary     Patient Name: Felicia Lucas DOB: [birth year] MRN: [id number]  Date of admission: 01/15/2022 Delivery date:01/17/2022  Delivering provider: Shelda Lucas  Date of discharge: 01/19/2022  Admitting diagnosis: Post-dates pregnancy [O48.0] Intrauterine pregnancy: [redacted]w[redacted]d     Secondary diagnosis:  Principal Problem:   Post-dates pregnancy  Additional problems: N/A    Discharge diagnosis: Term Pregnancy Delivered                                              Post partum procedures: none Augmentation: Pitocin and Cytotec Complications: None  Hospital course: Onset of Labor With Vaginal Delivery      26 y.o. yo G2P0010 at [redacted]w[redacted]d was admitted in Latent Labor on 01/15/2022. Patient had an uncomplicated labor course as follows:  Membrane Rupture Time/Date: 8:00 PM ,01/15/2022   Delivery Method:Vaginal, Spontaneous  Episiotomy: None  Lacerations:  None  Patient had an uncomplicated postpartum course.  She is ambulating, tolerating a regular diet, passing flatus, and urinating well. Patient is discharged home in stable condition on 01/19/22.  Newborn Data: Birth date:01/17/2022  Birth time:2:59 AM  Gender:Female  Living status:Living  Apgars:2 ,5  Weight:   Magnesium Sulfate received: No BMZ received: No Rhophylac:N/A MMR:N/A T-DaP:Given prenatally Flu: N/A Transfusion:No  Physical exam  Vitals:   01/18/22 2011 01/18/22 2354 01/19/22 0442 01/19/22 0826  BP: 113/70 (!) 108/56 112/65 109/77  Pulse: 79 76 68 71  Resp: $Remo'17 18 18 18  'RrPOr$ Temp: 98.1 F (36.7 C) 98 F (36.7 C) 98 F (36.7 C) 97.8 F (36.6 C)  TempSrc: Oral Oral Oral Oral  SpO2: 98% 100% 98% 100%  Weight:      Height:       General: alert, cooperative, and no distress Lochia: appropriate Uterine Fundus: firm DVT Evaluation: No evidence of DVT seen on physical exam. Labs: Lab Results  Component Value Date   WBC 17.0 (H) 01/18/2022   HGB 10.4 (L) 01/18/2022   HCT  30.9 (L) 01/18/2022   MCV 82.4 01/18/2022   PLT 217 01/18/2022      Latest Ref Rng & Units 01/16/2022    5:20 PM  CMP  Glucose 70 - 99 mg/dL 96   BUN 6 - 20 mg/dL <5   Creatinine 0.44 - 1.00 mg/dL 0.82   Sodium 135 - 145 mmol/L 133   Potassium 3.5 - 5.1 mmol/L 3.4   Chloride 98 - 111 mmol/L 103   CO2 22 - 32 mmol/L 21   Calcium 8.9 - 10.3 mg/dL 9.5   Total Protein 6.5 - 8.1 g/dL 7.1   Total Bilirubin 0.3 - 1.2 mg/dL 1.3   Alkaline Phos 38 - 126 U/L 150   AST 15 - 41 U/L 19   ALT 0 - 44 U/L 12    Edinburgh Score:     No data to display           After visit meds:  Allergies as of 01/19/2022       Reactions   Food Rash   Eggplant        Medication List     STOP taking these medications    aspirin EC 81 MG tablet   ondansetron 4 MG disintegrating tablet Commonly known as: ZOFRAN-ODT   promethazine 12.5 MG tablet Commonly known as: PHENERGAN  TAKE these medications    acetaminophen 325 MG tablet Commonly known as: Tylenol Take 2 tablets (650 mg total) by mouth every 4 (four) hours as needed (for pain scale < 4). What changed:  medication strength how much to take when to take this reasons to take this   benzocaine 10 % mucosal gel Commonly known as: ORAJEL Use as directed 1 Application in the mouth or throat as needed for mouth pain.   ibuprofen 600 MG tablet Commonly known as: ADVIL Take 1 tablet (600 mg total) by mouth every 6 (six) hours.   norethindrone 0.35 MG tablet Commonly known as: MICRONOR Take 1 tablet (0.35 mg total) by mouth daily.   prenatal vitamin w/FE, FA 27-1 MG Tabs tablet Take 1 tablet by mouth daily at 12 noon.         Discharge home in stable condition Infant Feeding: Bottle and Breast Infant Disposition:home with mother Discharge instruction: per After Visit Summary and Postpartum booklet. Activity: Advance as tolerated. Pelvic rest for 6 weeks.  Diet: routine diet Future Appointments: Future  Appointments  Date Time Provider [place]  02/22/2022 10:55 AM Clarnce Flock, MD [facility]   Follow up Visit:  [place] [facility] Follow up on 02/22/2022.   Specialty: Obstetrics and Gynecology Why: 9:55 on 9/29 with Dr. Hyman Hopes information: [place] [id number] [phone]                Message sent to [facility] on 01/19/22 by Simmie Davies Please schedule this patient for a In person postpartum visit in 6 weeks with the following provider: Any provider. Additional Postpartum F/U: none   Low risk pregnancy complicated by:  none Delivery mode:  Vaginal, Spontaneous  Anticipated Birth Control:   pills   01/19/2022 [name], MD

## 2022-01-18 LAB — CBC
HCT: 30.9 % — ABNORMAL LOW (ref 36.0–46.0)
Hemoglobin: 10.4 g/dL — ABNORMAL LOW (ref 12.0–15.0)
MCH: 27.7 pg (ref 26.0–34.0)
MCHC: 33.7 g/dL (ref 30.0–36.0)
MCV: 82.4 fL (ref 80.0–100.0)
Platelets: 217 10*3/uL (ref 150–400)
RBC: 3.75 MIL/uL — ABNORMAL LOW (ref 3.87–5.11)
RDW: 14.9 % (ref 11.5–15.5)
WBC: 17 10*3/uL — ABNORMAL HIGH (ref 4.0–10.5)
nRBC: 0 % (ref 0.0–0.2)

## 2022-01-18 NOTE — Progress Notes (Signed)
CSW completed chart reviewed and met with MOB to complete an assessment for food insecurities.  When CSW arrived to infant's bedside, FOB was holding infant and MOB was observing their interactions; everyone appeared happy and comfortable. CSW explained CSW's role the couple with receptive to meeting with CSW.  MOB gave CSW permission to complete the assessment while FOB was present.  FOB appeared to be a support for MOB and it engaged with CSW.  MOB was polite and easy to engage.  CSW asked about food insecurities and MOB denied having any insecurities. Per MOB, FOB works and provides all needs to the family.  CSW offered resources for local food banks and MOB and FOB declined. MOB reported that she receives [facility] plans to apply for food stamps on Monday (8/28).  Per the couple they have all essential to care for infant's post discharge.   There are no barriers to discharge.   Laurey Arrow, MSW, LCSW Clinical Social Work [phone]

## 2022-01-18 NOTE — Progress Notes (Signed)
Post Partum Day 1 Subjective: no complaints, up ad lib, voiding, and tolerating PO. No fevers or chills.   Objective: Blood pressure 106/62, pulse 71, temperature (!) 97.5 F (36.4 C), temperature source Oral, resp. rate 16, height 5\' 5"  (1.651 m), weight 103.4 kg, last menstrual period 04/11/2021, SpO2 97 %, unknown if currently breastfeeding.  Physical Exam:  General: alert, cooperative, and no distress Lochia: appropriate Uterine Fundus: firm Incision: n/a DVT Evaluation: No cords or calf tenderness.  Recent Labs    01/17/22 0713 01/18/22 0413  HGB 10.3* 10.4*  HCT 30.3* 30.9*    Assessment/Plan: Postpartum - Contraception: POP - MOF: Both - Rh status: Positive - Rubella status: Immune - Dispo: Anticipate D/C tomorrow - Consults: None  Neonatal - Doing well - may come out of the NICU today   LOS: 3 days   01/20/22 01/18/2022, 10:52 AM

## 2022-01-18 NOTE — Anesthesia Postprocedure Evaluation (Signed)
Anesthesia Post Note  Patient: CATARINA HUNTLEY  Procedure(s) Performed: AN AD HOC LABOR EPIDURAL     Patient location during evaluation: Mother Baby Anesthesia Type: Epidural Level of consciousness: awake and alert Pain management: pain level controlled Vital Signs Assessment: post-procedure vital signs reviewed and stable Respiratory status: spontaneous breathing, nonlabored ventilation and respiratory function stable Cardiovascular status: stable Postop Assessment: no headache, no backache, epidural receding, no apparent nausea or vomiting, patient able to bend at knees, adequate PO intake and able to ambulate Anesthetic complications: no   No notable events documented.  Last Vitals:  Vitals:   01/18/22 0012 01/18/22 0736  BP: 122/75 106/62  Pulse: 88 71  Resp: 18 16  Temp: 36.4 C (!) 36.4 C  SpO2: 100% 97%    Last Pain:  Vitals:   01/18/22 0736  TempSrc: Oral  PainSc:    Pain Goal: Patients Stated Pain Goal: 3 (01/17/22 1236)                 Laban Emperor

## 2022-01-19 MED ORDER — NORETHINDRONE 0.35 MG PO TABS: 1.0000 | ORAL_TABLET | Freq: Every day | ORAL | 6 refills | Status: AC

## 2022-01-19 MED ORDER — ACETAMINOPHEN 325 MG PO TABS: 650.0000 mg | ORAL_TABLET | ORAL | 0 refills | Status: AC | PRN

## 2022-01-19 MED ORDER — IBUPROFEN 600 MG PO TABS: 600.0000 mg | ORAL_TABLET | Freq: Four times a day (QID) | ORAL | 0 refills | Status: AC

## 2022-01-21 LAB — CULTURE, BLOOD (ROUTINE X 2)
Culture: NO GROWTH
Culture: NO GROWTH
Special Requests: ADEQUATE

## 2022-01-22 ENCOUNTER — Inpatient Hospital Stay (HOSPITAL_COMMUNITY): Payer: Medicaid Other

## 2022-01-22 ENCOUNTER — Inpatient Hospital Stay (HOSPITAL_COMMUNITY): Admission: AD | Admit: 2022-01-22 | Payer: Medicaid Other | Source: Home / Self Care | Admitting: Family Medicine

## 2022-01-25 ENCOUNTER — Telehealth (HOSPITAL_COMMUNITY): Payer: Self-pay | Admitting: *Deleted

## 2022-01-25 NOTE — Telephone Encounter (Signed)
Mom reports feeling good. No concerns about herself at this time. EPDS=4 [facility] score=2) Mom reports baby is doing well. Feeding, peeing, and pooping without difficulty. Safe sleep reviewed. Mom reports no concerns about baby at present.  Duffy Rhody, RN 01-25-2022 at 11:07am

## 2022-02-21 NOTE — Progress Notes (Signed)
Post Partum Visit Note  Felicia Lucas is a 26 y.o. G46P1011 female who presents for a postpartum visit. She is 5 weeks postpartum following a normal spontaneous vaginal delivery.  I have fully reviewed the prenatal and intrapartum course. The delivery was at [redacted]w[redacted]d gestational weeks.  Anesthesia: epidural. Postpartum course has been uneventful. Baby is doing well. Baby is feeding by both breast and bottle - Similac Advance. Bleeding staining only. Bowel function is normal. Bladder function is normal. Patient is sexually active. Contraception method is oral progesterone-only contraceptive. Postpartum depression screening: negative.   The pregnancy intention screening data noted above was reviewed. Potential methods of contraception were discussed. The patient elected to proceed with No data recorded.   Edinburgh Postnatal Depression Scale - 02/22/22 1124       Edinburgh Postnatal Depression Scale:  In the Past 7 Days   I have been able to laugh and see the funny side of things. 0    I have looked forward with enjoyment to things. 0    I have blamed myself unnecessarily when things went wrong. 0    I have been anxious or worried for no good reason. 0    I have felt scared or panicky for no good reason. 0    Things have been getting on top of me. 0    I have been so unhappy that I have had difficulty sleeping. 0    I have felt sad or miserable. 0    I have been so unhappy that I have been crying. 0    The thought of harming myself has occurred to me. 0    Edinburgh Postnatal Depression Scale Total 0             Health Maintenance Due  Topic Date Due   COVID-19 Vaccine (1) Never done   HPV VACCINES (1 - 2-dose series) Never done   INFLUENZA VACCINE  Never done    The following portions of the patient's history were reviewed and updated as appropriate: allergies, current medications, past family history, past medical history, past social history, past surgical history, and  problem list.  Review of Systems Pertinent items noted in HPI and remainder of comprehensive ROS otherwise negative.  Objective:  BP 97/67   Pulse 87   Wt 204 lb (92.5 kg)   LMP  (LMP Unknown)   Breastfeeding No   BMI 33.95 kg/m    General:  alert, cooperative, and appears stated age   Breasts:  not indicated  Lungs: Comfortalbe on room air  Wound N/a  GU exam:  not indicated         Assessment:    There are no diagnoses linked to this encounter.  Normal postpartum exam.   Plan:   Essential components of care per ACOG recommendations:  1.  Mood and well being: Patient with negative depression screening today. Reviewed local resources for support.  - Patient tobacco use? No.   - hx of drug use? No.    2. Infant care and feeding:  -Patient currently breastmilk feeding? No.  -Social determinants of health (SDOH) reviewed in EPIC. The following needs were identified: offered food market, accepts  3. Sexuality, contraception and birth spacing - Patient does not want a pregnancy in the next year.  Desired family size is 1 children.  - Reviewed reproductive life planning. Reviewed contraceptive methods based on pt preferences and effectiveness.  Patient desired IUD or IUS today.  Will schedule follow up  visit for insertion - Discussed birth spacing of 18 months  4. Sleep and fatigue -Encouraged family/partner/community support of 4 hrs of uninterrupted sleep to help with mood and fatigue  5. Physical Recovery  - Discussed patients delivery and complications. She describes her labor as good. - Patient had a Vaginal, no problems at delivery. Patient had no laceration. Perineal healing reviewed. Patient expressed understanding - Patient has urinary incontinence? No. - Patient is safe to resume physical and sexual activity  6.  Health Maintenance - HM due items addressed No - up to date - Last pap smear  Diagnosis  Date Value Ref Range Status  06/27/2021   Final   -  Negative for intraepithelial lesion or malignancy (NILM)   Pap smear not done at today's visit.  -Breast Cancer screening indicated? No.   7. Chronic Disease/Pregnancy Condition follow up: None  - PCP follow up  [facility]

## 2022-02-22 ENCOUNTER — Ambulatory Visit (INDEPENDENT_AMBULATORY_CARE_PROVIDER_SITE_OTHER): Payer: Medicaid Other | Admitting: Family Medicine

## 2022-02-22 ENCOUNTER — Encounter: Payer: Self-pay | Admitting: Family Medicine

## 2022-02-22 NOTE — Patient Instructions (Signed)
Dental Offices  [facility]: [phone]  [facility]: [phone]  [facility]: [phone]  

## 2022-03-21 ENCOUNTER — Other Ambulatory Visit: Payer: Self-pay

## 2022-03-21 ENCOUNTER — Ambulatory Visit (INDEPENDENT_AMBULATORY_CARE_PROVIDER_SITE_OTHER): Payer: Medicaid Other | Admitting: Family Medicine

## 2022-03-21 ENCOUNTER — Encounter: Payer: Self-pay | Admitting: Family Medicine

## 2022-03-21 VITALS — BP 107/76 | HR 81 | Wt 212.4 lb

## 2022-03-21 DIAGNOSIS — Z3009 Encounter for other general counseling and advice on contraception: Secondary | ICD-10-CM | POA: Diagnosis not present

## 2022-03-21 DIAGNOSIS — Z30430 Encounter for insertion of intrauterine contraceptive device: Secondary | ICD-10-CM | POA: Diagnosis not present

## 2022-03-21 DIAGNOSIS — Z7251 High risk heterosexual behavior: Secondary | ICD-10-CM | POA: Diagnosis not present

## 2022-03-21 MED ORDER — LEVONORGESTREL 20.1 MCG/DAY IU IUD
1.0000 | INTRAUTERINE_SYSTEM | Freq: Once | INTRAUTERINE | Status: AC
  Administered 2022-03-21: 1 via INTRAUTERINE

## 2022-03-21 NOTE — Progress Notes (Signed)
    Contraception/Family Planning VISIT ENCOUNTER NOTE  Subjective:   Felicia Lucas is a 26 y.o. G67P1011 female here for reproductive life counseling.  Desires effectiveness and decreased bleeding from [facility].  Reports she does not want a pregnancy in the next year. Stopped OCP and had LMP last week.  Denies abnormal vaginal bleeding, discharge, pelvic pain, problems with intercourse or other gynecologic concerns.    Gynecologic History Patient's last menstrual period was 03/13/2022 (exact date). Contraception: none  Health Maintenance Due  Topic Date Due   COVID-19 Vaccine (1) Never done   HPV VACCINES (1 - 2-dose series) Never done   INFLUENZA VACCINE  Never done     The following portions of the patient's history were reviewed and updated as appropriate: allergies, current medications, past family history, past medical history, past social history, past surgical history and problem list.  Review of Systems Pertinent items are noted in HPI.   Objective:  BP 107/76   Pulse 81   Wt 212 lb 6.4 oz (96.3 kg)   LMP 03/13/2022 (Exact Date)   HC 14.5" (36.8 cm)   Breastfeeding No   BMI 35.35 kg/m  Gen: well appearing, NAD HEENT: no scleral icterus CV: RR Lung: Normal WOB Ext: warm well perfused  PELVIC: Normal appearing external genitalia; normal appearing vaginal mucosa and cervix.  No abnormal discharge noted.   IUD Insertion Procedure Note Patient identified, informed consent performed, consent signed.   Discussed risks of irregular bleeding, cramping, infection, malpositioning or misplacement of the IUD outside the uterus which may require further procedure such as laparoscopy. Time out was performed.  Urine pregnancy test negative.  Speculum placed in the vagina.  Cervix visualized.  Cleaned with Betadine x 2.  Grasped anteriorly with a single tooth tenaculum.  Uterus sounded to 8 cm.  IUD placed per manufacturer's recommendations.  Strings trimmed to 3 cm. Tenaculum  was removed, good hemostasis noted.  Patient tolerated procedure well.    Assessment and Plan:   Contraception counseling: reviewed use of IUD for ECP as the patient had unprotected sex last night  1. Encounter for IUD insertion Patient was given post-procedure instructions.  She was advised to have backup contraception for one week.  Patient was also asked to check IUD strings periodically and follow up in 4 weeks for IUD check.    Please refer to After Visit Summary for other counseling recommendations.   Return for IUD string check PRN if cannot feel strings.  Caren Macadam, MD, MPH, ABFM Attending [place] for [facility]

## 2022-04-01 ENCOUNTER — Encounter: Payer: Self-pay | Admitting: General Practice

## 2022-08-10 IMAGING — US US OB < 14 WEEKS - US OB TV
1 series · 15 of 28 positions shown · non-contrast
Comparison: None.

CLINICAL DATA: Vaginal bleeding

EXAM:
OBSTETRIC <14 WK US AND TRANSVAGINAL OB US
TECHNIQUE: Both transabdominal and transvaginal ultrasound examinations were
performed for complete evaluation of the gestation as well as the
maternal uterus, adnexal regions, and pelvic cul-de-sac.
Transvaginal technique was performed to assess early pregnancy.

[Series 1: us ob < 14 weeks - us ob tv · 15 of 56 slices shown]
[im 1/56]
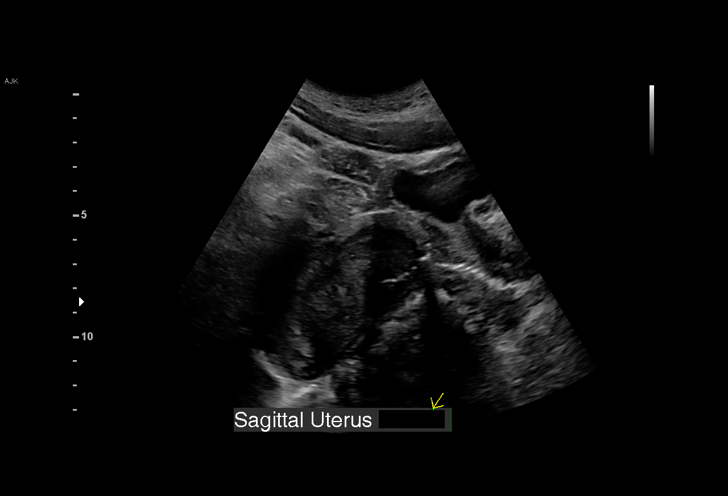
[im 5/56]
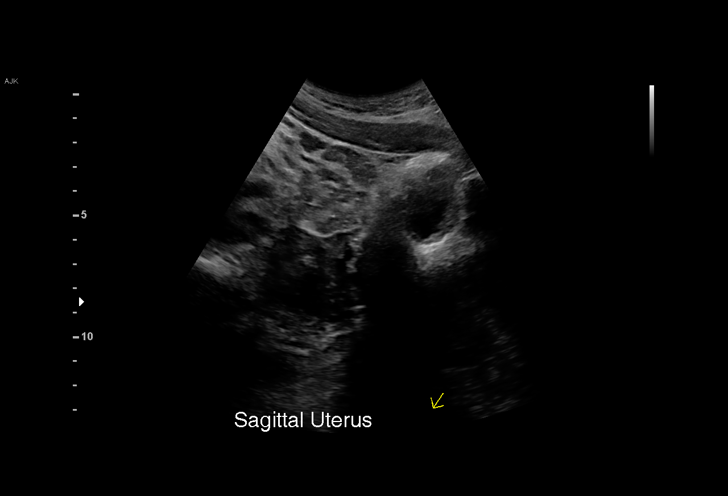
[im 9/56]
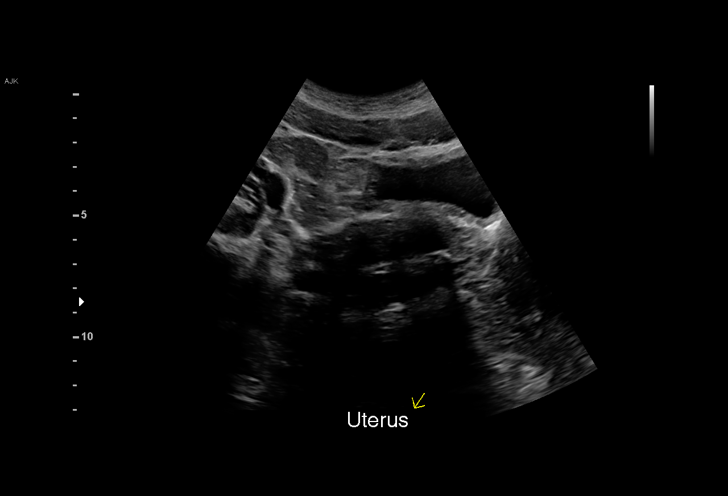
[im 13/56]
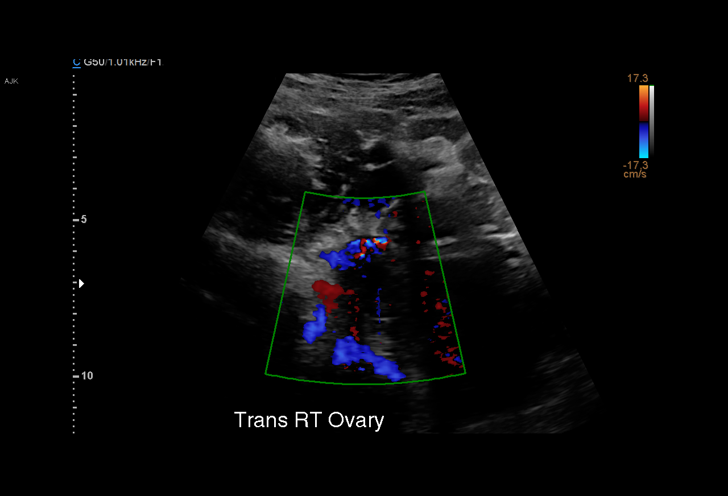
[im 17/56]
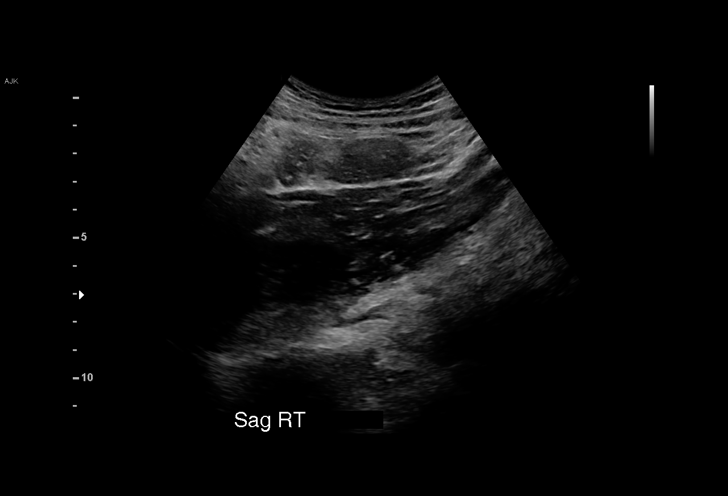
[im 21/56]
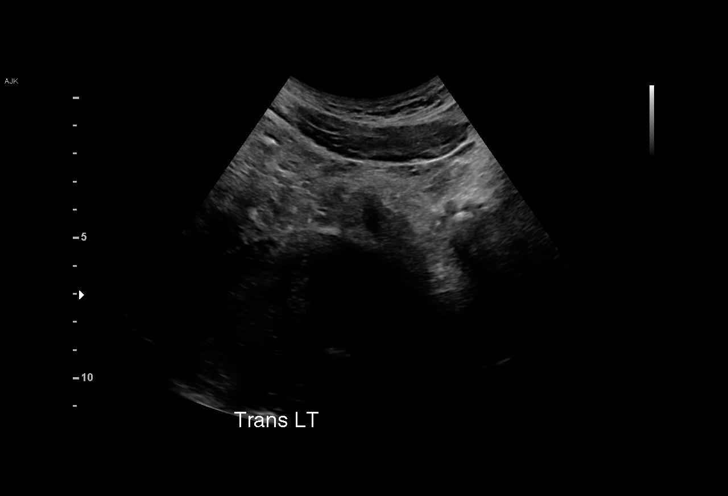
[im 25/56]
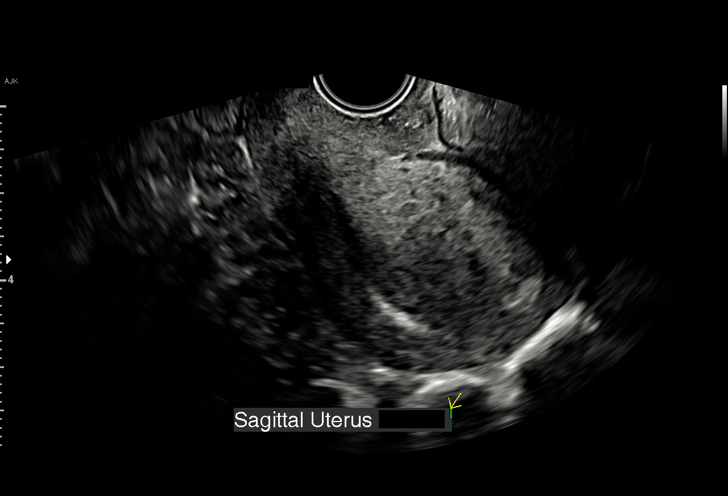
[im 29/56]
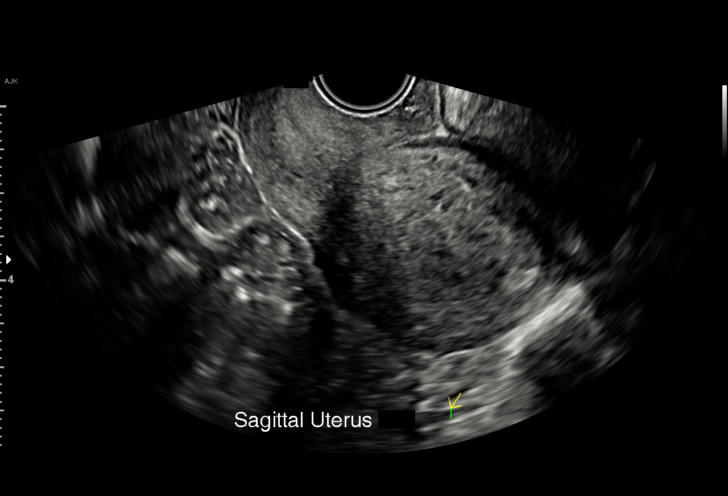
[im 31/56]
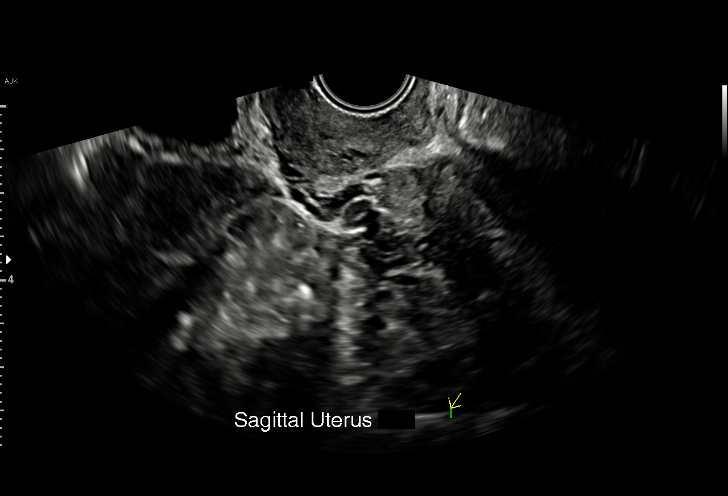
[im 35/56]
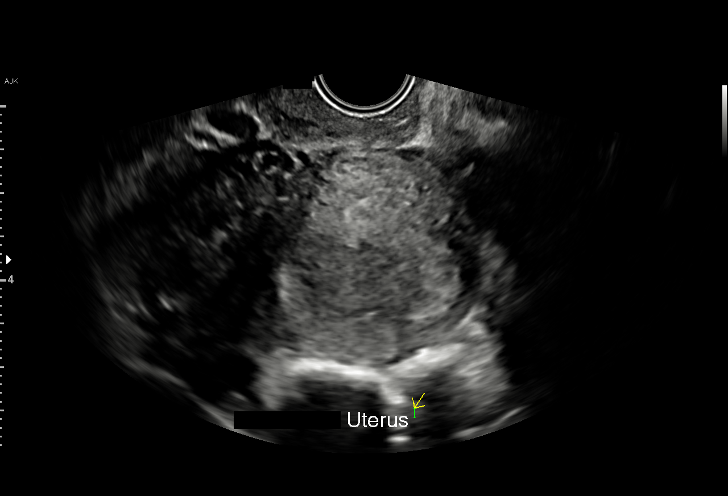
[im 39/56]
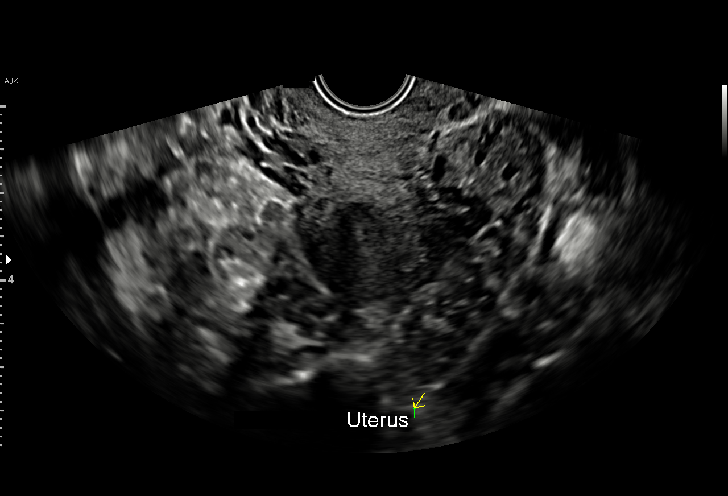
[im 43/56]
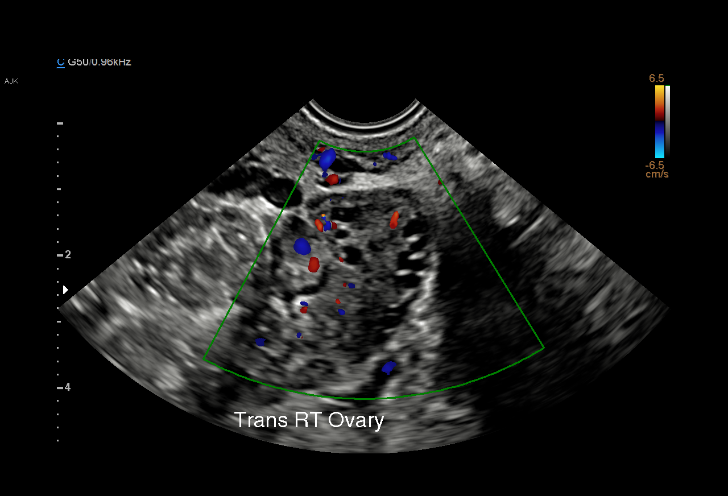
[im 47/56]
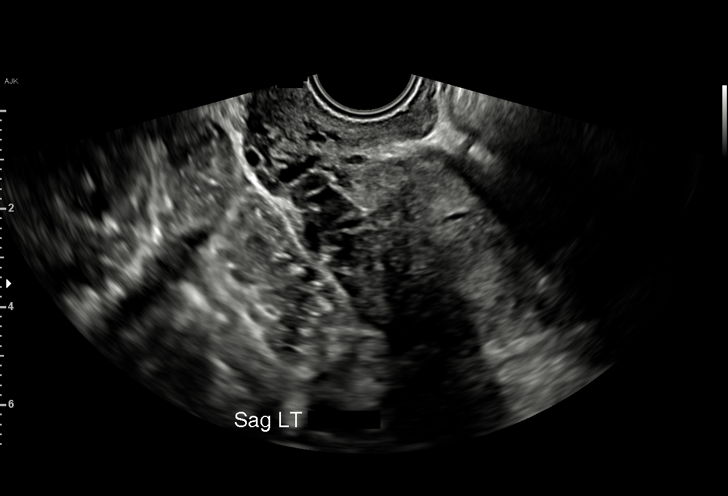
[im 51/56]
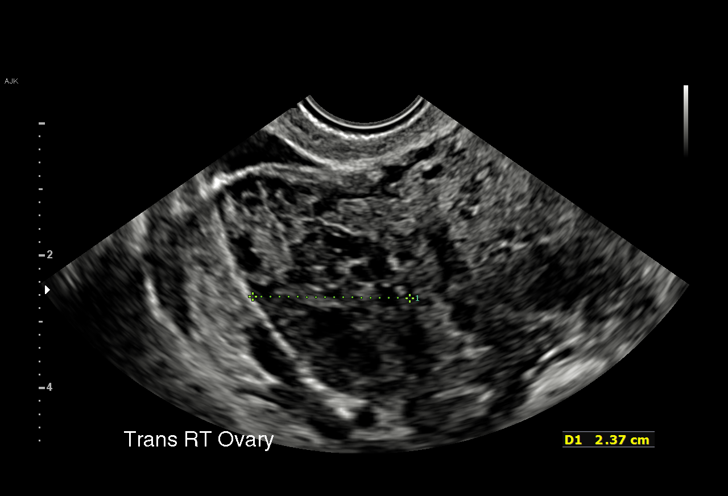
[im 56/56]
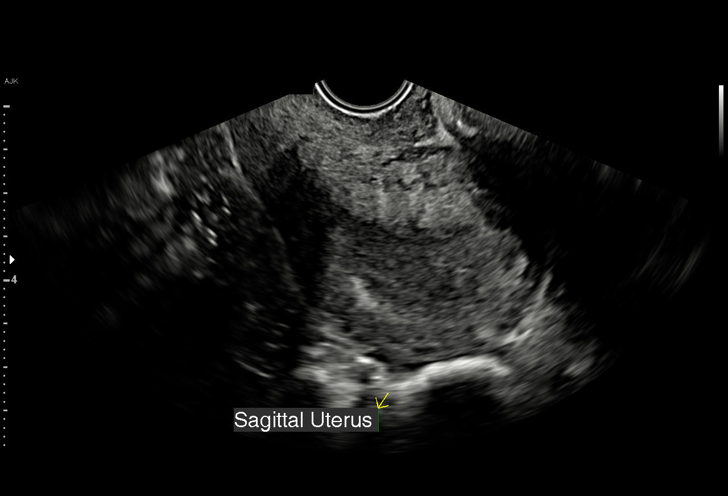

[15 of 28 positions shown; findings below may reference images not displayed]

FINDINGS: Intrauterine gestational sac: None

Yolk sac:  Not Visualized.

Embryo:  Not Visualized.

Maternal uterus/adnexae: Ovaries are within normal limits. Right
ovary measures 2.8 x 2.9 by 2.4 cm. The left ovary measures 3.5 x 3
by 2.5 cm. No significant free fluid
IMPRESSION: No IUP identified. Findings consistent with pregnancy of unknown
location, differential of which includes IUP too early to visualize,
recent failed pregnancy, and occult ectopic pregnancy. Recommend
trending of HCG with repeat ultrasound as indicated

## 2022-08-19 IMAGING — US US OB < 14 WEEKS - US OB TV
1 series · 15 of 28 positions shown · non-contrast
Comparison: Pelvic ultrasound dated 11/28/2020.

CLINICAL DATA: 25-year-old pregnant female status post methotrexate
treatment on 11/28/2020 presenting with pelvic pain. LMP: 10/12/2020
corresponding to an estimated gestational age of 7 weeks, 1 day.

EXAM:
OBSTETRIC <14 WK US AND TRANSVAGINAL OB US
TECHNIQUE: Transvaginal ultrasound was performed for complete evaluation of the
gestation as well as the maternal uterus, adnexal regions, and
pelvic cul-de-sac.

[Series 1: us ob < 14 weeks - us ob tv · 40 acquisitions, 15 frames shown]
[im 1/40]
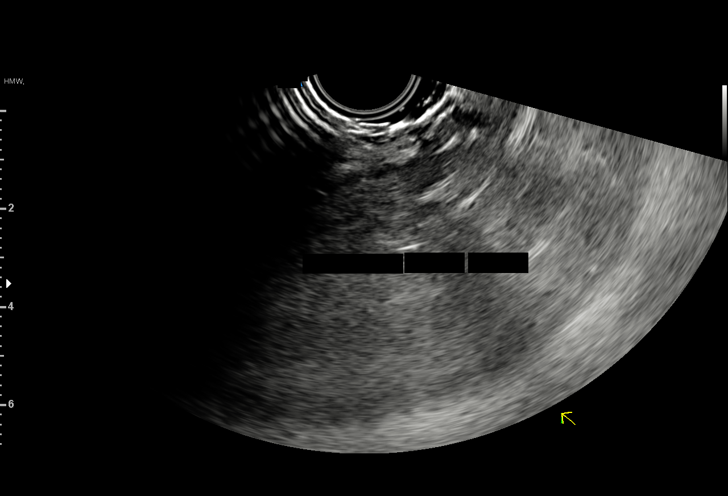
[im 3/40]
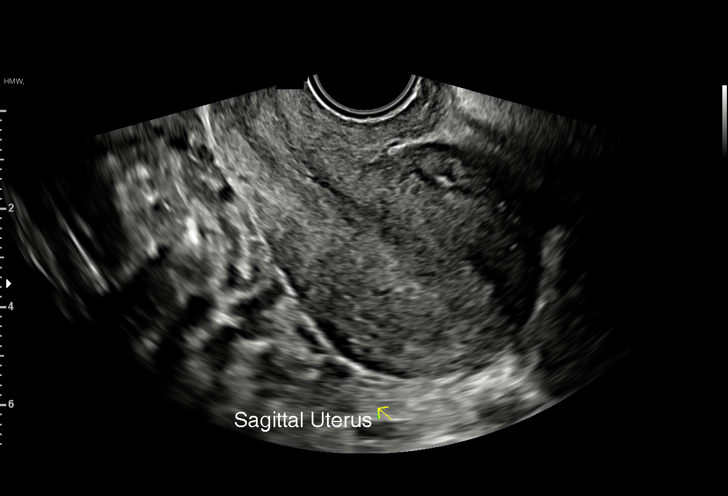
[im 6/40]
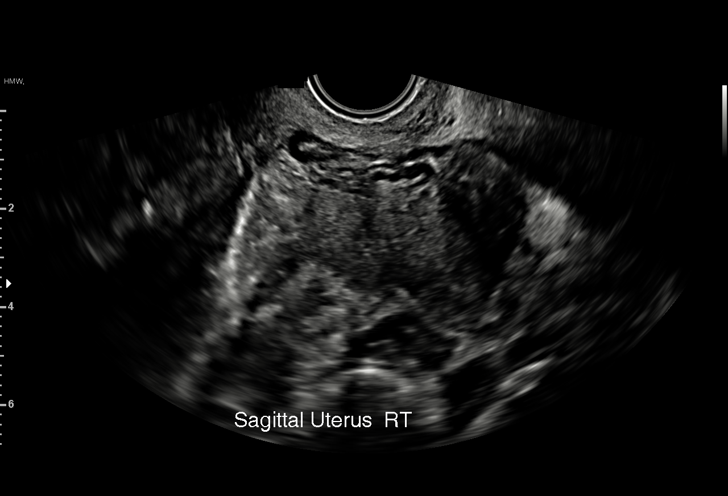
[im 9/40]
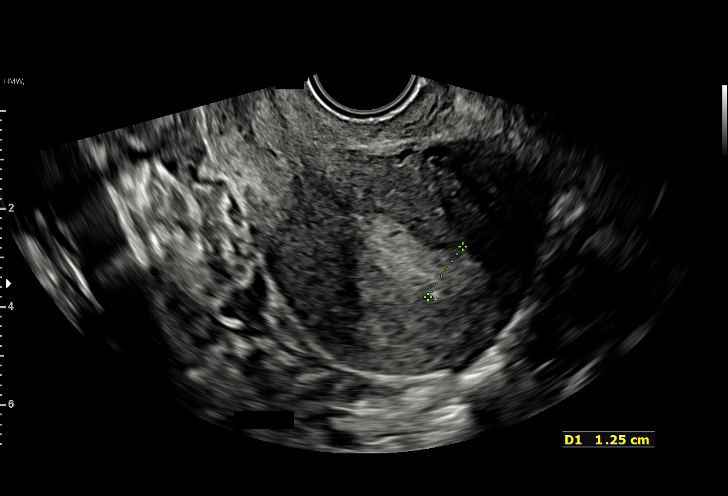
[im 12/40]
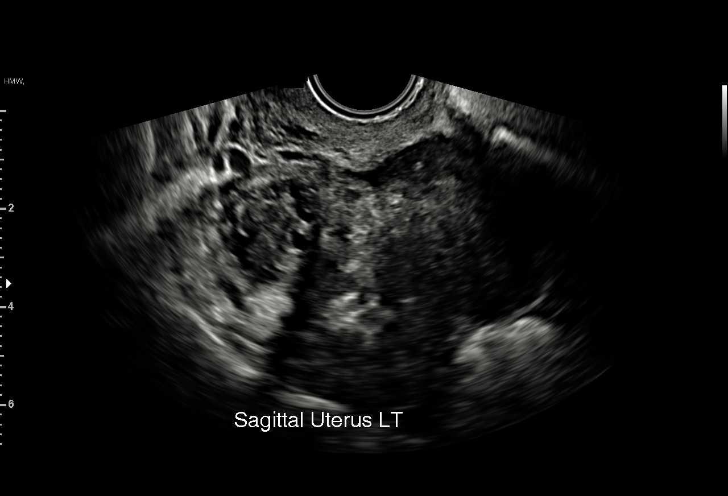
[im 15/40]
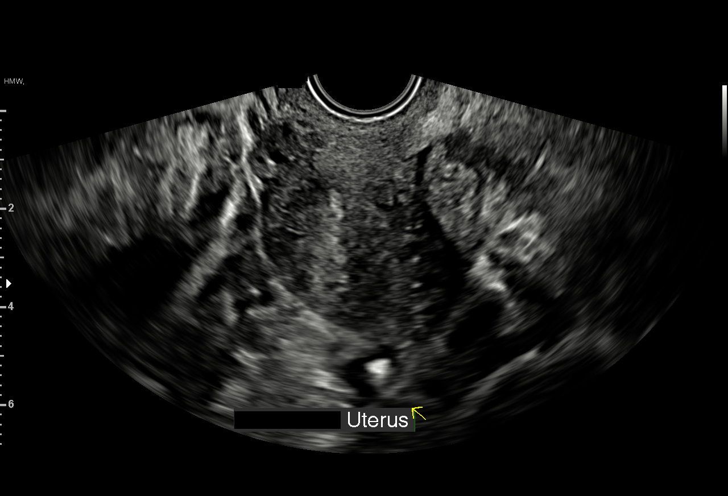
[im 18/40]
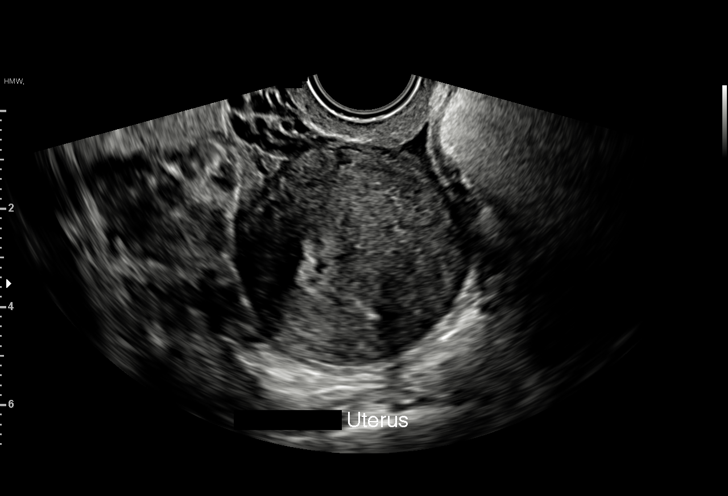
[im 21/40]
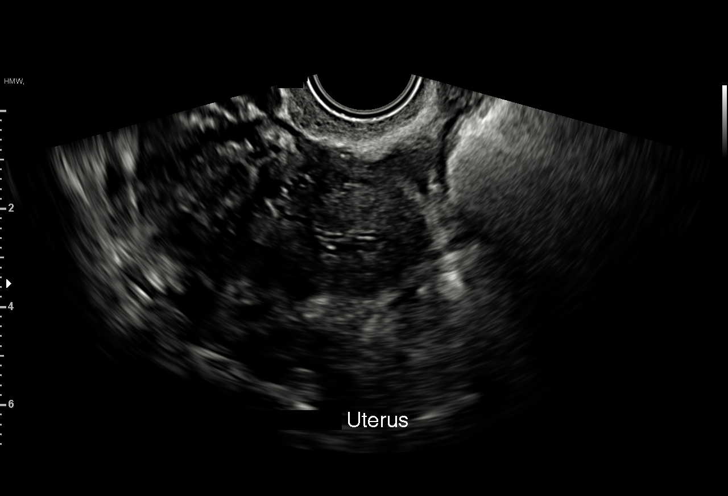
[im 22/40]
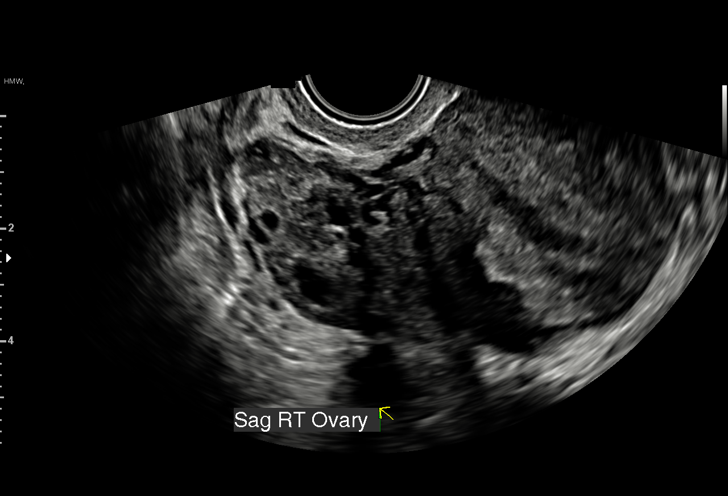
[im 25/40]
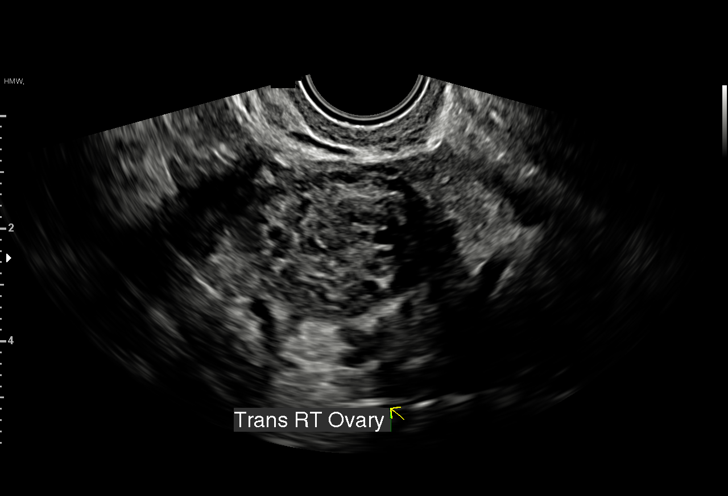
[im 28/40]
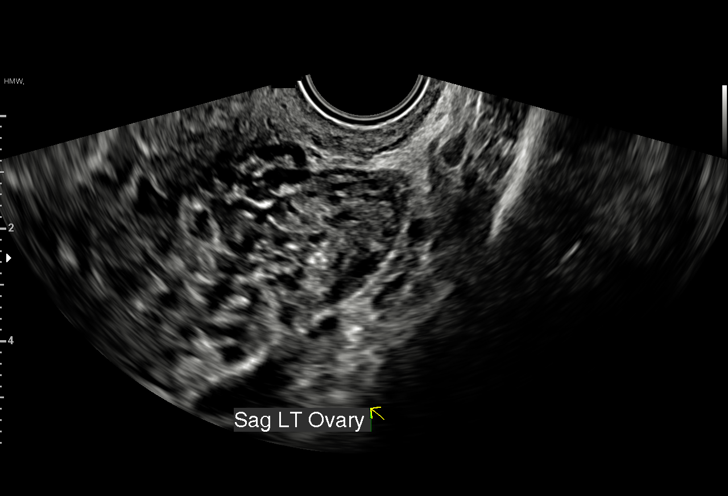
[im 31/40]
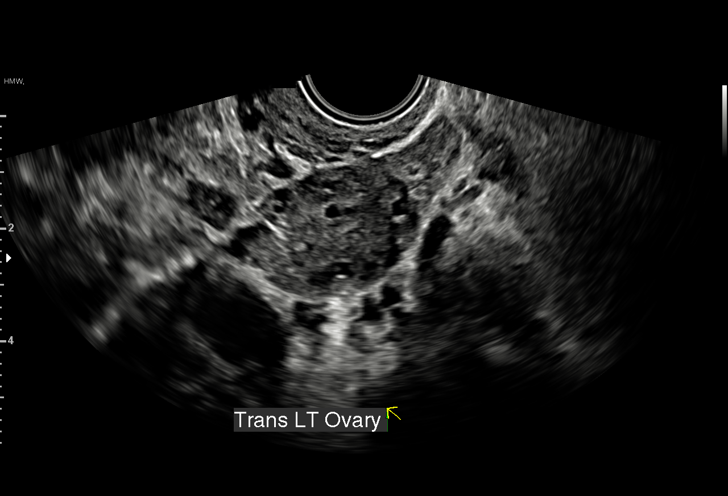
[im 34/40]
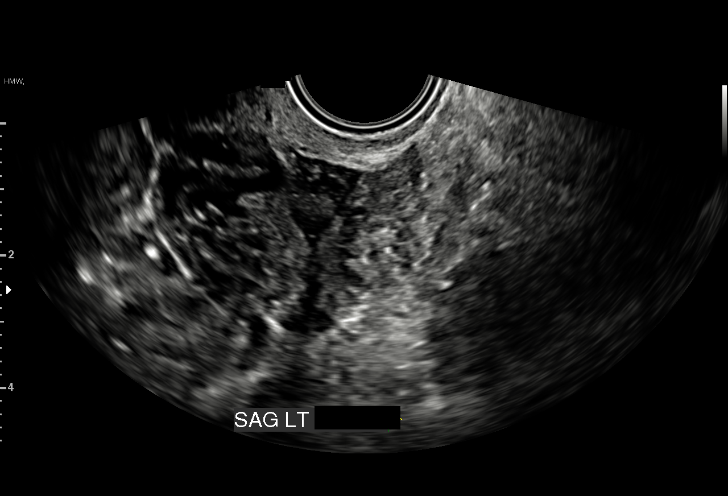
[im 37/40]
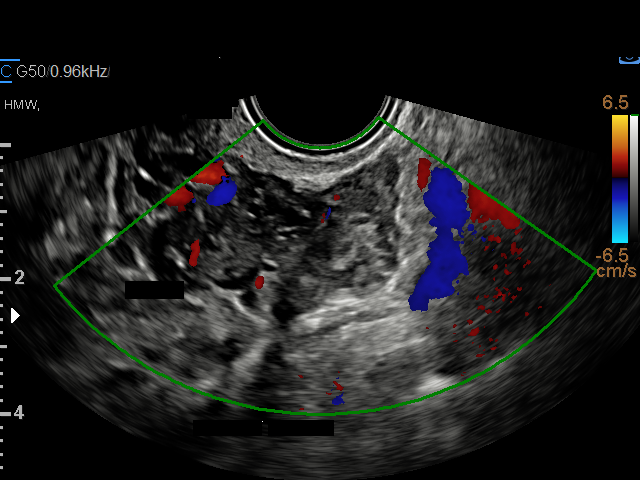
[im 40/40]
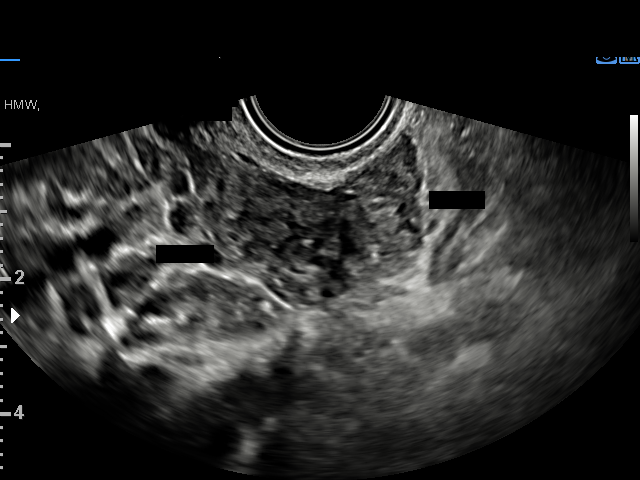

[15 of 28 positions shown; findings below may reference images not displayed]

FINDINGS: The uterus is retroverted and appears unremarkable.

The endometrium measures 13 mm in thickness. No intrauterine
pregnancy identified.

The right ovary measures 3.9 x 2.7 x 1.9 cm and appears
unremarkable.

The left ovary measures 3.1 x 2.8 x 2.3 cm and appears unremarkable.
There is a 2.8 x 1.4 x 1.6 cm solid echogenic mass adjacent and
lateral to the left ovary which appears separate from the ovary and
most concerning for ectopic pregnancy. Some vascularity noted along
the periphery of this mass.

No significant free fluid within the pelvis.
IMPRESSION: 1. No intrauterine pregnancy identified.
2. Solid mass lateral to the left ovary most concerning for an
ectopic pregnancy. Clinical correlation is recommended.
3. No significant free fluid within the pelvis.

These results were called by telephone at the time of interpretation
on 12/01/2020 at [DATE] to provider LAH TOLSON , who verbally
acknowledged these results.

## 2023-09-21 ENCOUNTER — Emergency Department (HOSPITAL_COMMUNITY)
Admission: EM | Admit: 2023-09-21 | Discharge: 2023-09-21 | Disposition: A | Discharge status: Home / Self Care | Attending: Emergency Medicine | Admitting: Emergency Medicine

## 2023-09-21 DIAGNOSIS — M545 Low back pain, unspecified: Secondary | ICD-10-CM | POA: Insufficient documentation

## 2023-09-21 MED ORDER — METHOCARBAMOL 500 MG PO TABS: 500.0000 mg | ORAL_TABLET | Freq: Three times a day (TID) | ORAL | 0 refills | Status: AC | PRN

## 2023-09-21 MED ORDER — PREDNISONE 20 MG PO TABS: 40.0000 mg | ORAL_TABLET | Freq: Every day | ORAL | 0 refills | Status: AC

## 2023-09-21 NOTE — ED Triage Notes (Signed)
 Pt c/o R sided lower back pain radiating down her R leg for one month. 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 No saddle anesthesia or bowel/bladder incontinence per pt.

## 2023-09-21 NOTE — ED Provider Notes (Signed)
 [place] EMERGENCY DEPARTMENT AT [facility] Provider Note   CSN: [id number] Arrival date & time: 09/21/23  1309     History  Chief Complaint  Patient presents with   Sciatica    Felicia Lucas is a 28 y.o. female.  HPI Patient presents with low back pain.  Had for the last couple weeks to a month.  Started at work.  No injury.  Does lift things while cleaning though.  Worse with certain movements.  No loss of bladder or bowel control.  No fever.  No IV drug use.  No cancer history.  Denies pregnancy.   No past medical history on file.  Home Medications Prior to Admission medications   Medication Sig Start Date End Date Taking? Authorizing Provider  acetaminophen  (TYLENOL ) 325 MG tablet Take 2 tablets (650 mg total) by mouth every 4 (four) hours as needed (for pain scale < 4). 01/19/22   Granville Layer, MD  benzocaine  (ORAJEL) 10 % mucosal gel Use as directed 1 Application in the mouth or throat as needed for mouth pain.    [provider]  ibuprofen  (ADVIL ) 600 MG tablet Take 1 tablet (600 mg total) by mouth every 6 (six) hours. 01/19/22   Granville Layer, MD  methocarbamol (ROBAXIN) 500 MG tablet Take 1 tablet (500 mg total) by mouth every 8 (eight) hours as needed. 09/21/23  Yes Mozell Arias, MD  norethindrone  (MICRONOR ) 0.35 MG tablet Take 1 tablet (0.35 mg total) by mouth daily. Patient not taking: Reported on 03/21/2022 01/19/22   Granville Layer, MD  predniSONE (DELTASONE) 20 MG tablet Take 2 tablets (40 mg total) by mouth daily. 09/21/23  Yes Mozell Arias, MD  prenatal vitamin w/FE, FA (PRENATAL 1 + 1) 27-1 MG TABS tablet Take 1 tablet by mouth daily at 12 noon. 06/20/21   Teena Feast, MD      Allergies    Food    Review of Systems   Review of Systems  Physical Exam Updated Vital Signs BP 115/80 (BP Location: Right Arm)   Pulse 79   Temp 98.1 F (36.7 C) (Oral)   Resp 16   SpO2 99%  Physical Exam Vitals and nursing note  reviewed.  Cardiovascular:     Rate and Rhythm: Regular rhythm.  Abdominal:     Tenderness: There is no abdominal tenderness.  Musculoskeletal:        General: Tenderness present.     Comments: Good straight leg raise on the right.  No rash.  No tenderness over lumbar spine but does have tenderness of her right SI joint area.  Pelvis stable to rock.  Skin:    General: Skin is warm.  Neurological:     Mental Status: She is alert and oriented to person, place, and time.     ED Results / Procedures / Treatments   Labs (all labs ordered are listed, but only abnormal results are displayed) Labs Reviewed - No data to display  EKG None  Radiology No results found.  Procedures Procedures    Medications Ordered in ED Medications - No data to display  ED Course/ Medical Decision Making/ A&P                                 Medical Decision Making Risk Prescription drug management.   Patient with right sided low back/pelvic pain.  Differential diagnosis includes causes such as musculoskeletal  pain.  Infection felt less likely.  No lumbar tenderness.  Will treat symptomatically with muscle relaxers and steroids.  Motrin  Tylenol  might help also.  Follow-up with her PCP as needed.        Final Clinical Impression(s) / ED Diagnoses Final diagnoses:  Acute right-sided low back pain without sciatica    Rx / DC Orders ED Discharge Orders          Ordered    predniSONE (DELTASONE) 20 MG tablet  Daily        09/21/23 1411    methocarbamol (ROBAXIN) 500 MG tablet  Every 8 hours PRN        09/21/23 1411              Mozell Arias, MD 09/21/23 1414

## 2023-09-25 ENCOUNTER — Other Ambulatory Visit: Payer: Self-pay

## 2023-09-25 ENCOUNTER — Emergency Department (HOSPITAL_COMMUNITY)
Admission: EM | Admit: 2023-09-25 | Discharge: 2023-09-25 | Disposition: A | Discharge status: Home / Self Care | Attending: Emergency Medicine | Admitting: Emergency Medicine

## 2023-09-25 DIAGNOSIS — W2102XA Struck by soccer ball, initial encounter: Secondary | ICD-10-CM | POA: Diagnosis not present

## 2023-09-25 DIAGNOSIS — S0993XA Unspecified injury of face, initial encounter: Secondary | ICD-10-CM | POA: Diagnosis present

## 2023-09-25 DIAGNOSIS — K047 Periapical abscess without sinus: Secondary | ICD-10-CM

## 2023-09-25 DIAGNOSIS — S01501A Unspecified open wound of lip, initial encounter: Secondary | ICD-10-CM | POA: Diagnosis not present

## 2023-09-25 LAB — CBC
HCT: 42.6 % (ref 36.0–46.0)
Hemoglobin: 14.5 g/dL (ref 12.0–15.0)
MCH: 30.4 pg (ref 26.0–34.0)
MCHC: 34 g/dL (ref 30.0–36.0)
MCV: 89.3 fL (ref 80.0–100.0)
Platelets: 233 10*3/uL (ref 150–400)
RBC: 4.77 MIL/uL (ref 3.87–5.11)
RDW: 11.2 % — ABNORMAL LOW (ref 11.5–15.5)
WBC: 5.6 10*3/uL (ref 4.0–10.5)
nRBC: 0 % (ref 0.0–0.2)

## 2023-09-25 LAB — COMPREHENSIVE METABOLIC PANEL WITH GFR
ALT: 13 U/L (ref 0–44)
AST: 14 U/L — ABNORMAL LOW (ref 15–41)
Albumin: 4.2 g/dL (ref 3.5–5.0)
Alkaline Phosphatase: 49 U/L (ref 38–126)
Anion gap: 12 (ref 5–15)
BUN: 9 mg/dL (ref 6–20)
CO2: 23 mmol/L (ref 22–32)
Calcium: 9.3 mg/dL (ref 8.9–10.3)
Chloride: 100 mmol/L (ref 98–111)
Creatinine, Ser: 0.55 mg/dL (ref 0.44–1.00)
GFR, Estimated: >60 mL/min (ref >60)
Glucose, Bld: 113 mg/dL — ABNORMAL HIGH (ref 70–99)
Potassium: 3.2 mmol/L — ABNORMAL LOW (ref 3.5–5.1)
Sodium: 135 mmol/L (ref 135–145)
Total Bilirubin: 0.8 mg/dL (ref 0.0–1.2)
Total Protein: 8.5 g/dL — ABNORMAL HIGH (ref 6.5–8.1)

## 2023-09-25 LAB — I-STAT CHEM 8, ED
BUN: 7 mg/dL (ref 6–20)
Calcium, Ion: 1.17 mmol/L (ref 1.15–1.40)
Chloride: 101 mmol/L (ref 98–111)
Creatinine, Ser: 0.7 mg/dL (ref 0.44–1.00)
Glucose, Bld: 111 mg/dL — ABNORMAL HIGH (ref 70–99)
HCT: 43 % (ref 36.0–46.0)
Hemoglobin: 14.6 g/dL (ref 12.0–15.0)
Potassium: 3.4 mmol/L — ABNORMAL LOW (ref 3.5–5.1)
Sodium: 138 mmol/L (ref 135–145)
TCO2: 26 mmol/L (ref 22–32)

## 2023-09-25 LAB — HCG, SERUM, QUALITATIVE: Preg, Serum: NEGATIVE

## 2023-09-25 LAB — I-STAT CG4 LACTIC ACID, ED: Lactic Acid, Venous: 0.5 mmol/L (ref 0.5–1.9)

## 2023-09-25 MED ORDER — OXYCODONE HCL 5 MG PO TABS
5.0000 mg | ORAL_TABLET | Freq: Once | ORAL | Status: AC
  Administered 2023-09-25: 5 mg via ORAL
  Filled 2023-09-25: qty 1

## 2023-09-25 MED ORDER — AMOXICILLIN-POT CLAVULANATE 875-125 MG PO TABS: 1.0000 | ORAL_TABLET | Freq: Two times a day (BID) | ORAL | 0 refills | Status: AC

## 2023-09-25 MED ORDER — OXYCODONE HCL 5 MG PO TABS: 5.0000 mg | ORAL_TABLET | ORAL | 0 refills | Status: AC | PRN

## 2023-09-25 NOTE — ED Provider Notes (Incomplete)
 [place] EMERGENCY DEPARTMENT AT [facility] Provider Note   CSN: [id number] Arrival date & time: 09/25/23  7829     History {Add pertinent medical, surgical, social history, OB history to HPI:1} Chief Complaint  Patient presents with  . Dental Injury    Felicia Lucas is a 28 y.o. female otherwise healthy presents with complaints of dental injury.  Patient states she was hit in the face with a soccer ball 2 days ago.  States she busted her bottom lip.  Today she woke up with swelling about her face with drainage and a loose tooth.  No fevers or chills.  Feels well otherwise.   Dental Injury   No past medical history on file. No past surgical history on file.     Home Medications Prior to Admission medications   Medication Sig Start Date End Date Taking? Authorizing Provider  acetaminophen  (TYLENOL ) 325 MG tablet Take 2 tablets (650 mg total) by mouth every 4 (four) hours as needed (for pain scale < 4). 01/19/22   Granville Layer, MD  benzocaine  (ORAJEL) 10 % mucosal gel Use as directed 1 Application in the mouth or throat as needed for mouth pain.    [provider]  ibuprofen  (ADVIL ) 600 MG tablet Take 1 tablet (600 mg total) by mouth every 6 (six) hours. 01/19/22   Granville Layer, MD  methocarbamol  (ROBAXIN ) 500 MG tablet Take 1 tablet (500 mg total) by mouth every 8 (eight) hours as needed. 09/21/23   Mozell Arias, MD  norethindrone  (MICRONOR ) 0.35 MG tablet Take 1 tablet (0.35 mg total) by mouth daily. Patient not taking: Reported on 03/21/2022 01/19/22   Granville Layer, MD  predniSONE  (DELTASONE ) 20 MG tablet Take 2 tablets (40 mg total) by mouth daily. 09/21/23   Mozell Arias, MD  prenatal vitamin w/FE, FA (PRENATAL 1 + 1) 27-1 MG TABS tablet Take 1 tablet by mouth daily at 12 noon. 06/20/21   Teena Feast, MD      Allergies    Food    Review of Systems   Review of Systems  HENT:  Positive for dental problem.     Physical  Exam Updated Vital Signs BP 121/85   Pulse (!) 119   Temp 98.1 F (36.7 C) (Oral)   Resp 18   Ht 5\' 5"  (1.651 m)   Wt 81.6 kg   SpO2 100%   BMI 29.95 kg/m  Physical Exam Vitals and nursing note reviewed.  Constitutional:      General: She is not in acute distress.    Appearance: She is well-developed.  HENT:     Head: Normocephalic and atraumatic.     Comments: There is a healing wound on the bottom lip, there is swelling on the top lip and periorbitally, tooth #9 is loose with purulent drainage.  There is no trismus or pooling secretions Eyes:     Conjunctiva/sclera: Conjunctivae normal.  Cardiovascular:     Rate and Rhythm: Normal rate and regular rhythm.     Heart sounds: No murmur heard. Pulmonary:     Effort: Pulmonary effort is normal. No respiratory distress.     Breath sounds: Normal breath sounds.  Musculoskeletal:        General: No swelling.     Cervical back: Neck supple.  Skin:    General: Skin is warm and dry.     Capillary Refill: Capillary refill takes less than 2 seconds.  Neurological:     Mental  Status: She is alert.  Psychiatric:        Mood and Affect: Mood normal.     ED Results / Procedures / Treatments   Labs (all labs ordered are listed, but only abnormal results are displayed) Labs Reviewed  CBC  COMPREHENSIVE METABOLIC PANEL WITH GFR  HCG, SERUM, QUALITATIVE  I-STAT CG4 LACTIC ACID, ED    EKG None  Radiology No results found.  Procedures Procedures  {Document cardiac monitor, telemetry assessment procedure when appropriate:1}  Medications Ordered in ED Medications - No data to display  ED Course/ Medical Decision Making/ A&P   {   Click here for ABCD2, HEART and other calculatorsREFRESH Note before signing :1}                              Medical Decision Making Amount and/or Complexity of Data Reviewed Labs: ordered.   This patient presents to the ED with chief complaint(s) of dental injury.  The complaint involves  an extensive differential diagnosis and also carries with it a high risk of complications and morbidity.   Pertinent past medical history as listed in HPI  The differential diagnosis includes  Dental fracture, subluxation, dental abscess, Ludwig's angina Additional history obtained: Records reviewed Care Everywhere/External Records  Assessment and management:   Patient presents tachycardic to 119 with complaints of dental injury that occurred 2 days ago.  Patient was hit in the face with a soccer ball.  She sustained a wound to the bottom lip that is healing.  She woke up this morning with facial swelling extended into the top lip.  Tooth 9 is loose with some purulent drainage surrounding this.  She denies any fevers, chills.  She feels well otherwise.  No chest pain or shortness of breath.  Independent ECG interpretation:  ***  Independent labs interpretation:  The following labs were independently interpreted:  CBC and Chem-8 without significant abnormality, lactate within normal limits  Independent visualization and interpretation of imaging: I independently visualized the following imaging with scope of interpretation limited to determining acute life threatening conditions related to emergency care: None   Consultations obtained:   ***  Disposition:   ***  Social Determinants of Health:   none  This note was dictated with voice recognition software.  Despite best efforts at proofreading, errors may have occurred which can change the documentation meaning.    {Document critical care time when appropriate:1} {Document review of labs and clinical decision tools ie heart score, Chads2Vasc2 etc:1}  {Document your independent review of radiology images, and any outside records:1} {Document your discussion with family members, caretakers, and with consultants:1} {Document social determinants of health affecting pt's care:1} {Document your decision making why or why not admission,  treatments were needed:1} Final Clinical Impression(s) / ED Diagnoses Final diagnoses:  None    Rx / DC Orders ED Discharge Orders     None
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
## 2023-09-25 NOTE — Discharge Instructions (Signed)
 You were evaluated emergency room for tooth pain.  Prescription for antibiotics has been sent into your pharmacy.  Please follow-up with a dentist within the next few days.  Please complete the full course of antibiotics.  Prescription for pain medication has additionally been sent into pharmacy.  Please alternate Tylenol  and ibuprofen  indigestion to this medication.

## 2024-03-17 ENCOUNTER — Other Ambulatory Visit (HOSPITAL_COMMUNITY): Payer: Self-pay | Admitting: Physician Assistant

## 2024-03-17 DIAGNOSIS — M5442 Lumbago with sciatica, left side: Secondary | ICD-10-CM

## 2024-08-02 ENCOUNTER — Encounter: Admitting: Obstetrics and Gynecology
